# Patient Record
Sex: Female | Born: 1950 | Race: White | Hispanic: No | Smoking: Former smoker
Health system: Southern US, Community
[De-identification: ages and names within clinical notes are randomized; demographics above are authoritative.]

## PROBLEM LIST (undated history)

## (undated) DIAGNOSIS — I252 Old myocardial infarction: Secondary | ICD-10-CM

## (undated) DIAGNOSIS — I2510 Atherosclerotic heart disease of native coronary artery without angina pectoris: Secondary | ICD-10-CM

## (undated) DIAGNOSIS — I10 Essential (primary) hypertension: Secondary | ICD-10-CM

---

## 1998-05-29 ENCOUNTER — Other Ambulatory Visit: Admission: RE | Admit: 1998-05-29 | Discharge: 1998-05-29 | Payer: Self-pay | Admitting: Gynecology

## 2001-09-04 ENCOUNTER — Other Ambulatory Visit: Admission: RE | Admit: 2001-09-04 | Discharge: 2001-09-04 | Payer: Self-pay | Admitting: Gynecology

## 2002-11-07 ENCOUNTER — Other Ambulatory Visit: Admission: RE | Admit: 2002-11-07 | Discharge: 2002-11-07 | Payer: Self-pay | Admitting: Gynecology

## 2002-11-16 ENCOUNTER — Other Ambulatory Visit: Admission: RE | Admit: 2002-11-16 | Discharge: 2002-11-16 | Payer: Self-pay

## 2003-12-12 ENCOUNTER — Other Ambulatory Visit: Admission: RE | Admit: 2003-12-12 | Discharge: 2003-12-12 | Payer: Self-pay | Admitting: Gynecology

## 2004-12-15 ENCOUNTER — Other Ambulatory Visit: Admission: RE | Admit: 2004-12-15 | Discharge: 2004-12-15 | Payer: Self-pay | Admitting: Gynecology

## 2005-04-14 ENCOUNTER — Observation Stay (HOSPITAL_COMMUNITY): Admission: EM | Admit: 2005-04-14 | Discharge: 2005-04-15 | Payer: Self-pay | Admitting: Emergency Medicine

## 2006-01-06 ENCOUNTER — Other Ambulatory Visit: Admission: RE | Admit: 2006-01-06 | Discharge: 2006-01-06 | Payer: Self-pay | Admitting: Gynecology

## 2007-01-31 ENCOUNTER — Other Ambulatory Visit: Admission: RE | Admit: 2007-01-31 | Discharge: 2007-01-31 | Payer: Self-pay | Admitting: Gynecology

## 2010-07-24 NOTE — H&P (Signed)
NAME:  SAYAKA, HOEPPNER                  ACCOUNT NO.:  [id number]   MEDICAL RECORD NO.:  [id number]          PATIENT TYPE:  EMS   LOCATION:  MAJO                         FACILITY:  [facility]   PHYSICIAN:  [name], MD      DATE OF BIRTH:  07/16/1950   DATE OF ADMISSION:  04/14/2005  DATE OF DISCHARGE:                                HISTORY & PHYSICAL   Audio too short to transcribe (less than 5 seconds)           ______________________________  [name], MD     SY/MEDQ  D:  04/14/2005  T:  04/14/2005  Job:  [id number]

## 2010-07-24 NOTE — H&P (Signed)
[name], [name] NO.:  [id number]   MEDICAL RECORD NO.:  [id number]          PATIENT TYPE:  INP   LOCATION:  2620                         FACILITY:  [facility]   PHYSICIAN:  [name], MD      DATE OF BIRTH:  [birth year]   DATE OF ADMISSION:  04/14/2005  DATE OF DISCHARGE:  04/15/2005                                HISTORY & PHYSICAL   [name] is a 60 year old lady who is generally in good health. She smokes  one pack of cigarettes per day. She presented as outlined in the history and  physical with recurrent episodes of substernal chest pain, brief in  duration, associated on one occasion with radiation down the left arm. Also  noteworthy was that when the patient was being transported from a [facility] to [facility], she received two sublingual nitroglycerin  in the EMS vehicle and reported that she experienced a relief in her  symptoms from this medication. On arrival to the emergency room, her blood  pressure was 119/62. Electrocardiogram showed no significant ischemic  change.   PHYSICAL EXAM AT TIME OF ADMISSION:  At the time of admission, HEENT exam  was within normal limits. The chest was clear. Cardiovascular exam revealed  normal S1-S2 without rubs, murmurs or gallops. The abdomen was benign.  Neurologic testing and examination of the extremities was normal. Serial  cardiac enzymes were negative. LDL was 136. Consultation was obtained from  Dr. Verdis Prime of [facility]. He recommended with proceeding with a  Cardiolite study. Subsequent to admission, the patient was placed on  Lopressor 12.5 milligrams b.i.d., Lovenox 1 mg/kg subcutaneously every 12  hours as per pharmacy protocol and p.r.n. nitroglycerin as well as aspirin  325 milligrams daily. Cardiolite stress was carried on April 15, 2005. The  stress study was uneventful. The patient became somewhat dyspneic with  exertion. No EKG changes were identified. The  Cardiolite portion of the  study was completely normal per the verbal description of the radiologists.  Therefore, on April 15, 2005, the patient was discharged.   DISCHARGE DIAGNOSES:  1.  Atypical chest pain not of cardiac origin.  2.  A 30-pack-year smoking history.  3.  History of hysterectomy.   PLAN:  At discharge, the patient was advised that it was absolutely  essential that she discontinue cigarette smoking. Otherwise, she was given  no other specific recommendations. She was counseled to follow up with Dr.  Holley Bouche. Condition at time of discharge was good.           ______________________________  [name], MD     SY/MEDQ  D:  04/15/2005  T:  04/15/2005  Job:  [id number]   cc:   Holley Bouche, M.D.  Fax: [phone]   Lyn Records, M.D.  Fax: [phone]

## 2010-07-24 NOTE — H&P (Signed)
NAME:  [name], [name] NO.:  [id number]   MEDICAL RECORD NO.:  [id number]          PATIENT TYPE:  EMS   LOCATION:  MAJO                         FACILITY:  [facility]   PHYSICIAN:  [name], MD      DATE OF BIRTH:  [birth year]   DATE OF ADMISSION:  04/14/2005  DATE OF DISCHARGE:                                HISTORY & PHYSICAL   [name] is a 60 year old lady who works for an Scientist, product/process development office in  [employer].  She is generally in good health.  She admits to smoking about 1  pack of cigarettes per day.  Yesterday morning, she says she was putting on  cosmetics and looking in the mirror when she experienced a sudden onset of a  sharp pain in her chest which was different from the pain she had  previously.  She was slightly diaphoretic.  The pain was very brief in  duration.  After it resolved, she felt tired.  She then had 2 more episodes  of pain yesterday which caused her to be concerned.  Today she went to work  and, while at work, experienced the onset of a more severe substernal chest  pain which radiated down her left arm.  The pain was described as throbbing  in nature.  She presented to the [facility] in [facility] where  electrocardiogram was obtained which was normal.  In the Urgent Care, she  was given 4 aspirin to chew and directed to go to [facility] for  further evaluation.  EMS was called, and in transport to the emergency room,  she was given 2 sublingual nitroglycerin.  She said that her pain was 7/10  when she was in the ambulance.  The first nitroglycerin reduced the pain to  4/10, the second to 1/10.  She is quite definite that the pain was  significantly reduced by the nitroglycerin and that this was a relatively  rapid effect.   On arrival to the emergency room, her blood pressure was 119/62, temperature  97.1, O2 saturation 100%.  Electrocardiogram obtained at the Urgent Care  Center was reviewed and found to show no  significant ischemic changes.  In  light of this presentation, she is admitted for further evaluation.   PAST MEDICAL HISTORY:   MEDICATIONS:  Premarin 1 mg daily.   ALLERGIES:  No known drug allergies.   OPERATIONS:  1.  She has had a hysterectomy in the past.  2.  She has also had previous C section.   She has never been hospitalized for any medical problems.   MEDICAL ILLNESSES:  None.   FAMILY HISTORY:  Her father died at a young age, and the patient does not  know why.  Her brother has a history of hyperlipidemia and diabetes.  She  has several uncles who have hypertension.  She says that siblings are in  good health.   SOCIAL HISTORY:  She smokes about 1 pack of cigarettes per day.  She does  not use alcohol or drugs.   REVIEW OF SYSTEMS:  HEAD: She denies headache or dizziness. EYES: She denies  visual problems or diplopia. EAR, NOSE, THROAT: Denies earache, sinus pain,  or sore throat.  CHEST: Denies coughing, wheezing, or chest congestion.  CARDIOVASCULAR: Denies orthopnea or PND. GI: Denies nausea, vomiting,  abdominal pain, change in bowel habits, melena, or hematochezia.  GU: Denies  dysuria or urinary frequency. NEUROLOGIC: No history of seizure or stroke.  ENDOCRINE: Denies excessive thirst, urinary frequency, or nocturia.   PHYSICAL EXAMINATION:  HEENT:  Within normal limits.  CHEST: Remarkable for a few scattered wheezes.  CARDIOVASCULAR: Normal S1 and S2.  There are no rubs, murmurs, or gallops.  ABDOMEN:  Soft with normal bowel sounds.  No masses or tenderness.  NEUROLOGIC:  Testing normal.  EXTREMITIES:  Examination normal.   IMPRESSION:  1.  Atypical chest pain which was apparently dramatically relieved by      nitroglycerin.  Rule out myocardial infarction.  2.  A 30-pack-year smoking history.  3.  History of hysterectomy.   We will admit the patient to rule out myocardial infarction and obtain  cardiology consult or possible Cardiolite stress  test.           ______________________________  [name], MD     SY/MEDQ  D:  04/14/2005  T:  04/14/2005  Job:  [id number]   cc:   Holley Bouche, M.D.  Fax: [phone]   [facility]

## 2010-07-24 NOTE — Consult Note (Signed)
[name], REINO NO.:  [id number]   MEDICAL RECORD NO.:  [id number]          PATIENT TYPE:  INP   LOCATION:  2620                         FACILITY:  [facility]   PHYSICIAN:  Lyn Records, M.D.   DATE OF BIRTH:  1950-07-14   DATE OF CONSULTATION:  04/15/2005  DATE OF DISCHARGE:                                   CONSULTATION   REASON FOR CONSULTATION:  Chest discomfort.   CONCLUSION:  1.  Atypical chest discomfort described variously as a sharp discomfort, a      heaviness in the left precordium and a dull ache.  There was some      suggestion of pleuritic component.  Rule out coronary artery disease,      rule out pericarditis, rule out other.  2.  Smoking history, greater than 30 pack years.  3.  Hyperlipidemia.  4.  Bilateral salpingo-oophorectomy and hysterectomy.   RECOMMENDATIONS:  1.  Serial enzymes.  2.  Aspirin.  3.  Serial EKGs.  4.  D-dimer.  5.  Monitor.  6.  If rules out, stress Cardiolite should be performed given her risk      factor profile including age, tobacco use, hysterectomy with      oophorectomy.   COMMENTS:  The patient is a young-appearing 60 year old who is admitted  after a two-day history of atypical chest pain.  The initial episode of  discomfort occurred on the morning prior to admission (the actual day  preceding) when while dressing, she developed a mild ache in her chest that  lasted less than 10 minutes and resolved spontaneously.  This ache recurred  intermittently throughout the day.  It was not exertional related, nor was  there associated radiation, diaphoresis or other complaints.  There may have  been mild shortness of breath with it.  She had a nice evening the evening  prior to admission but upon awakening on the day of admission while blow-  drying her hair, she had discomfort again that she described as a pain in  the left upper chest and breast region that radiated into the left shoulder  and down the left  arm.  There was a sensation of heaviness in the arm and  hand with some tingling and also a sharp component.  This discomfort seemed  to be worse when she would take a deep breath or if she were lying down.  There was no nausea or sweating.  She came to the emergency room where she  was given aspirin and also given several nitroglycerin tablets with an  equivocal response.   EKG done during the discomfort was unremarkable for evidence of ischemia.  She did have an incomplete right bundle.   Significant past medical problems include tobacco use, hysterectomy and  salpingo-oophorectomy.   MEDICATIONS:  Premarin 0.1 mg per day.   FAMILY HISTORY:  Father died at a young age.  Mother has congestive heart  failure and diabetes.  She is living.   REVIEW OF SYSTEMS:  Unremarkable.   PHYSICAL EXAMINATION:  GENERAL: The patient is a pleasant  Caucasian in no  distress, appearing younger than her stated age of 60.  VITAL SIGNS:  Blood pressure 120/60, heart rate 65, respiratory rate 16.  HEENT:  Unremarkable.  NECK:  No jugular venous distention or carotid bruits.  LUNGS:  Clear, no pericardial rubs or murmurs heard.  ABDOMEN:  Soft.  EXTREMITIES:  No edema.  NEUROLOGIC:  Unremarkable.   EKG reveals incomplete right bundle.  No acute change.  All laboratory data  including initial __________ markers were normal.  Chest x-ray is  unremarkable.   DISCUSSION:  The patient's symptoms are atypical.  The etiology of the pain  is uncertain.  Possibilities at this time include ischemic heart disease  (unlikely), pleuropericarditis, musculoskeletal, or other.  We will rule out  myocardial infarction and get an ischemic evaluation if appropriate once the  data base is complete.      Lyn Records, M.D.  Electronically Signed     HWS/MEDQ  D:  04/15/2005  T:  04/15/2005  Job:  [id number]   cc:   Holley Bouche, M.D.  Fax: [phone]

## 2012-10-06 HISTORY — PX: CORONARY ANGIOPLASTY WITH STENT PLACEMENT: SHX49

## 2012-10-29 ENCOUNTER — Inpatient Hospital Stay (HOSPITAL_COMMUNITY)
Admission: EM | Admit: 2012-10-29 | Discharge: 2012-11-02 | DRG: 282 | Disposition: A | Discharge status: Home / Self Care | Payer: Medicaid Other | Attending: Cardiology | Admitting: Cardiology

## 2012-10-29 ENCOUNTER — Encounter (HOSPITAL_COMMUNITY): Payer: Self-pay | Admitting: Emergency Medicine

## 2012-10-29 ENCOUNTER — Emergency Department (HOSPITAL_COMMUNITY): Payer: Medicaid Other

## 2012-10-29 ENCOUNTER — Encounter (HOSPITAL_COMMUNITY)
Admission: EM | Disposition: A | Discharge status: Home / Self Care | Payer: Self-pay | Source: Home / Self Care | Attending: Cardiology

## 2012-10-29 DIAGNOSIS — E7800 Pure hypercholesterolemia, unspecified: Secondary | ICD-10-CM | POA: Diagnosis present

## 2012-10-29 DIAGNOSIS — I10 Essential (primary) hypertension: Secondary | ICD-10-CM | POA: Diagnosis present

## 2012-10-29 DIAGNOSIS — F17200 Nicotine dependence, unspecified, uncomplicated: Secondary | ICD-10-CM | POA: Diagnosis present

## 2012-10-29 DIAGNOSIS — I213 ST elevation (STEMI) myocardial infarction of unspecified site: Secondary | ICD-10-CM

## 2012-10-29 DIAGNOSIS — I2119 ST elevation (STEMI) myocardial infarction involving other coronary artery of inferior wall: Principal | ICD-10-CM

## 2012-10-29 DIAGNOSIS — I2510 Atherosclerotic heart disease of native coronary artery without angina pectoris: Secondary | ICD-10-CM | POA: Diagnosis present

## 2012-10-29 DIAGNOSIS — Z955 Presence of coronary angioplasty implant and graft: Secondary | ICD-10-CM

## 2012-10-29 DIAGNOSIS — Z23 Encounter for immunization: Secondary | ICD-10-CM

## 2012-10-29 DIAGNOSIS — D696 Thrombocytopenia, unspecified: Secondary | ICD-10-CM | POA: Diagnosis not present

## 2012-10-29 HISTORY — DX: Essential (primary) hypertension: I10

## 2012-10-29 HISTORY — DX: Old myocardial infarction: I25.2

## 2012-10-29 HISTORY — PX: LEFT HEART CATHETERIZATION WITH CORONARY ANGIOGRAM: SHX5451

## 2012-10-29 HISTORY — DX: Atherosclerotic heart disease of native coronary artery without angina pectoris: I25.10

## 2012-10-29 LAB — CBC
MCH: 33.7 pg (ref 26.0–34.0)
MCHC: 36.2 g/dL — ABNORMAL HIGH (ref 30.0–36.0)
MCV: 93.3 fL (ref 78.0–100.0)
RBC: 4.3 MIL/uL (ref 3.87–5.11)
RDW: 12.7 % (ref 11.5–15.5)

## 2012-10-29 LAB — BASIC METABOLIC PANEL
BUN: 9 mg/dL (ref 6–23)
GFR calc Af Amer: >90 mL/min (ref >90)
Glucose, Bld: 139 mg/dL — ABNORMAL HIGH (ref 70–99)

## 2012-10-29 LAB — TROPONIN I: Troponin I: >20 ng/mL (ref <0.30)

## 2012-10-29 SURGERY — LEFT HEART CATHETERIZATION WITH CORONARY ANGIOGRAM
Anesthesia: LOCAL

## 2012-10-29 MED ORDER — FENTANYL CITRATE 0.05 MG/ML IJ SOLN
INTRAMUSCULAR | Status: AC
  Filled 2012-10-29: qty 2

## 2012-10-29 MED ORDER — MIDAZOLAM HCL 2 MG/2ML IJ SOLN
INTRAMUSCULAR | Status: AC
  Filled 2012-10-29: qty 2

## 2012-10-29 MED ORDER — NITROGLYCERIN IN D5W 200-5 MCG/ML-% IV SOLN
10.0000 ug/min | INTRAVENOUS | Status: DC
  Administered 2012-10-29: 10 ug/min via INTRAVENOUS
  Filled 2012-10-29: qty 250

## 2012-10-29 MED ORDER — LIDOCAINE HCL (PF) 1 % IJ SOLN
INTRAMUSCULAR | Status: AC
  Filled 2012-10-29: qty 30

## 2012-10-29 MED ORDER — VERAPAMIL HCL 2.5 MG/ML IV SOLN
INTRAVENOUS | Status: AC
  Filled 2012-10-29: qty 2

## 2012-10-29 MED ORDER — HEPARIN (PORCINE) IN NACL 100-0.45 UNIT/ML-% IJ SOLN
600.0000 [IU]/h | INTRAMUSCULAR | Status: DC
  Administered 2012-10-29: 600 [IU]/h via INTRAVENOUS
  Filled 2012-10-29: qty 250

## 2012-10-29 MED ORDER — ONDANSETRON HCL 4 MG/2ML IJ SOLN
4.0000 mg | Freq: Once | INTRAMUSCULAR | Status: AC
  Administered 2012-10-29: 4 mg via INTRAVENOUS
  Filled 2012-10-29: qty 2

## 2012-10-29 MED ORDER — HEPARIN SODIUM (PORCINE) 5000 UNIT/ML IJ SOLN
60.0000 [IU]/kg | Freq: Once | INTRAMUSCULAR | Status: AC
  Administered 2012-10-29: 3100 [IU] via INTRAVENOUS
  Filled 2012-10-29: qty 1

## 2012-10-29 MED ORDER — EPTIFIBATIDE 75 MG/100ML IV SOLN
INTRAVENOUS | Status: AC
  Filled 2012-10-29: qty 100

## 2012-10-29 MED ORDER — NITROGLYCERIN 0.2 MG/ML ON CALL CATH LAB
INTRAVENOUS | Status: AC
  Filled 2012-10-29: qty 1

## 2012-10-29 MED ORDER — TICAGRELOR 90 MG PO TABS
180.0000 mg | ORAL_TABLET | Freq: Once | ORAL | Status: DC
  Filled 2012-10-29: qty 2

## 2012-10-29 MED ORDER — HEPARIN (PORCINE) IN NACL 2-0.9 UNIT/ML-% IJ SOLN
INTRAMUSCULAR | Status: AC
  Filled 2012-10-29: qty 1500

## 2012-10-29 MED ORDER — MORPHINE SULFATE 4 MG/ML IJ SOLN
4.0000 mg | Freq: Once | INTRAMUSCULAR | Status: AC
  Administered 2012-10-29: 4 mg via INTRAVENOUS
  Filled 2012-10-29: qty 1

## 2012-10-29 MED ORDER — NITROGLYCERIN 0.4 MG SL SUBL
0.4000 mg | SUBLINGUAL_TABLET | SUBLINGUAL | Status: DC | PRN
  Administered 2012-10-29 (×2): 0.4 mg via SUBLINGUAL
  Filled 2012-10-29: qty 25

## 2012-10-29 MED ORDER — BIVALIRUDIN 250 MG IV SOLR
INTRAVENOUS | Status: AC
  Filled 2012-10-29: qty 250

## 2012-10-29 MED ORDER — ASPIRIN 81 MG PO CHEW
243.0000 mg | CHEWABLE_TABLET | Freq: Once | ORAL | Status: AC
  Administered 2012-10-29: 243 mg via ORAL
  Filled 2012-10-29: qty 3

## 2012-10-29 NOTE — ED Notes (Signed)
Pt placed on Zoll and groin shaved.

## 2012-10-29 NOTE — H&P (Signed)
[name] is an 62 y.o. female.   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 status post PTCA stenting to RCA approximately 9 years ago at [facility], hypertension, hypercholesterolemia, tobacco abuse, positive family history of coronary artery disease, came to the ER complaining of recurrent burning retrosternal chest pain grade 10 over 10 since yesterday evening radiating to both shoulder and arm associated with shortness of breath and diaphoresis did not seek any medical attention as she was in [place] visiting her friend, came home took aspirin and nitroglycerin with partial relief but due to recurrent chest pain decided to come to the ER patient presently complains of 3/6 burning chest pain EKG done in the ER showed normal sinus rhythm with Q waves in inferior with ST elevation and minor respirable changes in lead 1 and aVL and also her RSR pattern with ST depression in V1 to V3 suggestive of inferoposterior wall MI. Patient was also noted to have troponin I. Of above 45. Patient denies any palpitation lightheadedness or syncope but states feels overall tired and weak. States she has been off most of her medication for last few weeks.  Past Medical History  Diagnosis Date  . MI, old   . Hypertension   . Coronary artery disease     Past Surgical History  Procedure Laterality Date  . Cesarean section      X3    No family history on file. Social History:  reports that she quit smoking 7 days ago. Her smoking use included Cigarettes. She smoked 0.00 packs per day. She does not have any smokeless tobacco history on file. She reports that  drinks alcohol. She reports that she does not use illicit drugs.  Allergies: No Known Allergies   (Not in a hospital admission)  Results for orders placed during the hospital encounter of  10/29/12 (from the past 48 hour(s))  CBC     Status: Abnormal   Collection Time    10/29/12  8:05 PM      Result Value Range   WBC 14.5 (*) 4.0 - 10.5 K/uL   RBC 4.30  3.87 - 5.11 MIL/uL   Hemoglobin 14.5  12.0 - 15.0 g/dL   HCT 16.1  09.6 - 04.5 %   MCV 93.3  78.0 - 100.0 fL   MCH 33.7  26.0 - 34.0 pg   MCHC 36.2 (*) 30.0 - 36.0 g/dL   RDW 40.9  81.1 - 91.4 %   Platelets 162  150 - 400 K/uL  BASIC METABOLIC PANEL     Status: Abnormal   Collection Time    10/29/12  8:05 PM      Result Value Range   Sodium 131 (*) 135 - 145 mEq/L   Potassium 3.9  3.5 - 5.1 mEq/L   Chloride 95 (*) 96 - 112 mEq/L   CO2 23  19 - 32 mEq/L   Glucose, Bld 139 (*) 70 - 99 mg/dL   BUN 9  6 - 23 mg/dL   Creatinine, Ser 7.82  0.50 - 1.10 mg/dL   Calcium 9.5  8.4 - 95.6 mg/dL   GFR calc non Af Amer >90  >90 mL/min   GFR calc Af Amer >90  >90 mL/min   Comment: (NOTE)     The eGFR has been calculated using the CKD EPI equation.     This calculation  has not been validated in all clinical situations.     eGFR's persistently <90 mL/min signify possible Chronic Kidney     Disease.  TROPONIN I     Status: Abnormal   Collection Time    10/29/12  8:05 PM      Result Value Range   Troponin I >20.00 (*) <0.30 ng/mL   Comment: REPEATED TO VERIFY     CRITICAL RESULT CALLED TO, READ BACK BY AND VERIFIED WITH:     ALBR[facility] 10/29/12 2135 WAYK  POCT I-STAT TROPONIN I     Status: Abnormal   Collection Time    10/29/12  8:13 PM      Result Value Range   Troponin i, poc 42.69 (*) 0.00 - 0.08 ng/mL   Comment NOTIFIED PHYSICIAN     Comment 3            Comment: Due to the release kinetics of cTnI,     a negative result within the first hours     of the onset of symptoms does not rule out     myocardial infarction with certainty.     If myocardial infarction is still suspected,     repeat the test at appropriate intervals.   Dg Chest 2 View  10/29/2012   *RADIOLOGY REPORT*  Clinical Data: Chest pain.  CHEST  - 2 VIEW  Comparison: 09/23/2011 and 08/15/2006  Findings: Lungs are clear.  Cardiomediastinal silhouette and remainder of the exam is unchanged.  IMPRESSION: No acute cardiopulmonary disease.   Original Report Authenticated By: Elberta Fortis, M.D.    Review of Systems  Constitutional: Negative for fever and chills.  HENT: Negative for hearing loss.   Eyes: Negative for blurred vision and double vision.  Cardiovascular: Positive for chest pain. Negative for palpitations, orthopnea, claudication and leg swelling.  Gastrointestinal: Positive for nausea. Negative for vomiting, abdominal pain and diarrhea.  Neurological: Negative for dizziness and headaches.    Blood pressure 134/75, pulse 67, temperature 98.9 F (37.2 C), temperature source Oral, resp. rate 14, height 5\' 3"  (1.6 m), weight 51.256 kg (113 lb), SpO2 98.00%. Physical Exam  Constitutional: She is oriented to person, place, and time.  HENT:  Head: Normocephalic.  Eyes: Conjunctivae are normal. Pupils are equal, round, and reactive to light. Left eye exhibits no discharge. No scleral icterus.  Neck: Normal range of motion. Neck supple. No JVD present. No tracheal deviation present. No thyromegaly present.  Cardiovascular: Normal rate and regular rhythm.  Exam reveals no friction rub.   Murmur (Soft systolic murmur and S4 gallop noted) heard. Respiratory: Effort normal and breath sounds normal. No respiratory distress. She has no wheezes. She has no rales.  GI: Soft. Bowel sounds are normal. She exhibits no distension. There is no tenderness. There is no rebound.  Musculoskeletal: She exhibits no edema and no tenderness.  Neurological: She is alert and oriented to person, place, and time.     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 death MI stroke need for emergency CABG local last complications etc. and consented for PCI.  Robynn Pane 10/29/2012, 9:44 PM

## 2012-10-29 NOTE — ED Notes (Signed)
Per EMS - pt c/o CP onset around 8pm last night. Pt has previous MI and stents. sts she has been having achy pain bilateral upper extremities and headache. Pt vomiting last night and today. Pt took 3 nitro, pain resolved still having headache. EMS started a 20G in left hand. NSR on monitor, elevation in lead 3, left bundle branch block. Upon arrival to ED pt sts the pain is starting to come back.

## 2012-10-29 NOTE — ED Provider Notes (Signed)
CSN: [id number]     Arrival date & time 10/29/12  1859 History     First MD Initiated Contact with Patient 10/29/12 2012     No chief complaint on file.  (Consider location/radiation/quality/duration/timing/severity/associated sxs/prior Treatment) HPI Comments: Patient is a 62 year old female medical history significant for MI, RCA stent placement 7 years ago, hypertension, coronary artery disease presented to emergency department for 24 hours of central chest pressure with radiation to bilateral shoulders with associated nausea, vomiting, diaphoresis. Patient states her pain was "25/10" prior to taking a sublingual nitroglycerin and a baby aspirin prior to arrival to the emergency department. Patient states her pain is down to a 3/10. Patient states her cardiologist is out of [facility]. Her last stress test was 6 months ago with her last catheterization 4 years ago.  The history is provided by the patient.    Past Medical History  Diagnosis Date  . MI, old   . Hypertension   . Coronary artery disease    Past Surgical History  Procedure Laterality Date  . Cesarean section      X3   No family history on file. History  Substance Use Topics  . Smoking status: Former Smoker    Types: Cigarettes    Quit date: 10/22/2012  . Smokeless tobacco: Not on file  . Alcohol Use: Yes     Comment: occasionally   OB History   Grav Para Term Preterm Abortions TAB SAB Ect Mult Living                 Review of Systems  Constitutional: Positive for diaphoresis.  Respiratory: Negative for shortness of breath.   Cardiovascular: Positive for chest pain.  Gastrointestinal: Positive for nausea and vomiting.  All other systems reviewed and are negative.    Allergies  Review of patient's allergies indicates no known allergies.  Home Medications   No current outpatient prescriptions on file. BP 127/76  Pulse 77  Temp(Src) 98.9 F (37.2 C) (Oral)  Resp 20  Ht 5\' 3"  (1.6 m)   Wt 113 lb (51.256 kg)  BMI 20.02 kg/m2  SpO2 98% Physical Exam  Constitutional: She is oriented to person, place, and time. She appears well-developed and well-nourished.  HENT:  Head: Normocephalic and atraumatic.  Right Ear: External ear normal.  Left Ear: External ear normal.  Nose: Nose normal.  Mouth/Throat: Oropharynx is clear and moist.  Eyes: Conjunctivae and EOM are normal. Pupils are equal, round, and reactive to light.  Neck: Normal range of motion. Neck supple.  Cardiovascular: Normal rate, regular rhythm, normal heart sounds and intact distal pulses.   Pulmonary/Chest: Effort normal and breath sounds normal. No respiratory distress.  Abdominal: Soft. There is no tenderness.  Musculoskeletal: She exhibits no edema.  Neurological: She is alert and oriented to person, place, and time.  Skin: Skin is warm and dry.    ED Course   Procedures (including critical care time)  CRITICAL CARE Performed by: Francee Piccolo L   Total critical care time: 60 minutes  Critical care time was exclusive of separately billable procedures and treating other patients.  Critical care was necessary to treat or prevent imminent or life-threatening deterioration.  Critical care was time spent personally by me on the following activities: development of treatment plan with patient and/or surrogate as well as nursing, discussions with consultants, evaluation of patient's response to treatment, examination of patient, obtaining history from patient or surrogate, ordering and performing treatments and interventions, ordering and review  of laboratory studies, ordering and review of radiographic studies, pulse oximetry and re-evaluation of patient's condition.   Medications  nitroGLYCERIN (NITROSTAT) SL tablet 0.4 mg ( Sublingual MAR Hold 10/29/12 2212)  nitroGLYCERIN 0.2 mg/mL in dextrose 5 % infusion ( Intravenous MAR Hold 10/29/12 2212)  heparin ADULT infusion 100 units/mL (25000  units/250 mL) (600 Units/hr Intravenous New Bag/Given 10/29/12 2122)  Ticagrelor (BRILINTA) tablet 180 mg ( Oral MAR Hold 10/29/12 2212)  ondansetron (ZOFRAN) injection 4 mg (4 mg Intravenous Given 10/29/12 2029)  morphine 4 MG/ML injection 4 mg (4 mg Intravenous Given 10/29/12 2045)  heparin injection 3,100 Units (3,100 Units Intravenous Given 10/29/12 2057)  aspirin chewable tablet 243 mg (243 mg Oral Given 10/29/12 2055)  bivalirudin (ANGIOMAX) 250 MG injection (not administered)  fentaNYL (SUBLIMAZE) 0.05 MG/ML injection (not administered)  midazolam (VERSED) 2 MG/2ML injection (not administered)  lidocaine (PF) (XYLOCAINE) 1 % injection (not administered)  nitroGLYCERIN (NTG ON-CALL) 0.2 mg/mL injection (not administered)  heparin 2-0.9 UNIT/ML-% infusion (not administered)  eptifibatide (INTEGRILIN) 75 mg / 100 mL infusion (not administered)  verapamil (ISOPTIN) 2.5 MG/ML injection (not administered)    Date: 10/29/2012  Rate: 62   Rhythm: normal sinus rhythm  QRS Axis: normal  Intervals: normal  ST/T Wave abnormalities: ST elevation 1mm in V3, less than 1mm elevation in lead II and aVF  Conduction Disutrbances:right bundle branch block  Narrative Interpretation:   Old EKG Reviewed: none available    Labs Reviewed  CBC - Abnormal; Notable for the following:    WBC 14.5 (*)    MCHC 36.2 (*)    All other components within normal limits  BASIC METABOLIC PANEL - Abnormal; Notable for the following:    Sodium 131 (*)    Chloride 95 (*)    Glucose, Bld 139 (*)    All other components within normal limits  TROPONIN I - Abnormal; Notable for the following:    Troponin I >20.00 (*)    All other components within normal limits  POCT I-STAT TROPONIN I - Abnormal; Notable for the following:    Troponin i, poc 42.69 (*)    All other components within normal limits  HEPARIN LEVEL (UNFRACTIONATED)  CBC   Dg Chest 2 View  10/29/2012   *RADIOLOGY REPORT*  Clinical Data: Chest pain.   CHEST - 2 VIEW  Comparison: 09/23/2011 and 08/15/2006  Findings: Lungs are clear.  Cardiomediastinal silhouette and remainder of the exam is unchanged.  IMPRESSION: No acute cardiopulmonary disease.   Original Report Authenticated By: Elberta Fortis, M.D.   1. Acute MI, inferoposterior wall     MDM  Pt with CP x 24 hours with nausea, vomiting, and diaphoresis w/ history of RCA stent placement 7 years ago. CP initially alleviated with ASA, Nitro SL, and Morphine, but pain returned during Dr. Annitta Jersey evaluation of patient. EKG changes noted above with interpretation below. EKG ST elevation 1 mm in V3 less than 1 mm elevation in lead II and aVF, inferior Q waves, troponin 43, suspect patient either had STEMI or NSTEMI yesterday, Harwani to see Pt in ED for admit. Heparin gtt and bolus started, along with Nitro gtt. Dr. Sharyn Lull will take patient to cath lab with returned pain and EKG findings. After her money greater than STEMI T. STEMI status. Patient transported to cath lab.    Jeannetta Ellis, PA-C 10/29/12 2256

## 2012-10-29 NOTE — ED Notes (Signed)
Dr Fonnie Jarvis notified of pt high Troponin level 42.69

## 2012-10-29 NOTE — ED Provider Notes (Signed)
Medical screening examination/treatment/procedure(s) were conducted as a shared visit with non-physician practitioner(s) and myself.  I personally evaluated the patient during the encounter.  Greater than 24 hours of constant CP now pain free; yesterday and all night had burning chest pain radiating to both shoulders and arms nausea vomiting shortness breath and sweats, today less intense symptoms but still had burning chest discomfort all day into this evening without associated Sxs today, EKG ST elevation 1 mm in lead III and less than 1 mm elevation in leads II and aVF, inferior Q waves, troponin 43, suspect patient either had STEMI or NSTEMI yesterday, Harwani to see Pt in ED for admit.  When Dr. Sharyn Lull saw the patient in the ED the patient developed recurrent pain and Dr. Sharyn Lull activated Code STEMI.  Hurman Horn, MD 10/31/12 2056

## 2012-10-29 NOTE — Progress Notes (Signed)
ANTICOAGULATION CONSULT NOTE - Initial Consult  Pharmacy Consult for Heparin Indication: chest pain/ACS  No Known Allergies  Patient Measurements: Height: 5\' 3"  (160 cm) Weight: 113 lb (51.256 kg) IBW/kg (Calculated) : 52.4   Vital Signs: Temp: 98.9 F (37.2 C) (08/24 1942) Temp src: Oral (08/24 1942) BP: 129/78 mmHg (08/24 2016) Pulse Rate: 73 (08/24 1945)  Labs:  Recent Labs  10/29/12 2005  HGB 14.5  HCT 40.1  PLT 162    CrCl is unknown because no creatinine reading has been taken.   Medical History: Past Medical History  Diagnosis Date  . MI, old   . Hypertension   . Coronary artery disease     Medications:   (Not in a hospital admission)  Assessment: 62 yo F admitted 10/29/2012  With CP.  Pharmacy consulted to dose heparin.  No bleeding noted.  Goal of Therapy:  Heparin level 0.3-0.7 units/ml Monitor platelets by anticoagulation protocol: Yes   Plan:  Give 3000 units bolus x 1 Start heparin infusion at 600 units/hr Check anti-Xa level in 6 hours and daily while on heparin Continue to monitor H&H and platelets   Thank you for allowing pharmacy to be a part of this patients care team.  Lovenia Kim Pharm.D., BCPS Clinical Pharmacist 10/29/2012 9:00 PM Pager: [phone] Phone: [phone]

## 2012-10-29 NOTE — ED Notes (Signed)
Dr Sharyn Lull paged Code Stemi on patient at this time.

## 2012-10-29 NOTE — ED Notes (Signed)
Admitting MD in room with pt. 

## 2012-10-30 ENCOUNTER — Encounter (HOSPITAL_COMMUNITY): Payer: Self-pay | Admitting: *Deleted

## 2012-10-30 ENCOUNTER — Other Ambulatory Visit: Payer: Self-pay

## 2012-10-30 LAB — LIPID PANEL
Cholesterol: 145 mg/dL (ref 0–200)
HDL: 48 mg/dL (ref >39)
Total CHOL/HDL Ratio: 3 RATIO

## 2012-10-30 LAB — TROPONIN I
Troponin I: >20 ng/mL (ref <0.30)
Troponin I: >20 ng/mL (ref <0.30)
Troponin I: >20 ng/mL (ref <0.30)

## 2012-10-30 LAB — COMPREHENSIVE METABOLIC PANEL
BUN: 7 mg/dL (ref 6–23)
Calcium: 8.1 mg/dL — ABNORMAL LOW (ref 8.4–10.5)
GFR calc Af Amer: >90 mL/min (ref >90)
Glucose, Bld: 133 mg/dL — ABNORMAL HIGH (ref 70–99)
Sodium: 134 mEq/L — ABNORMAL LOW (ref 135–145)
Total Protein: 5.6 g/dL — ABNORMAL LOW (ref 6.0–8.3)

## 2012-10-30 LAB — BASIC METABOLIC PANEL
BUN: 13 mg/dL (ref 6–23)
Calcium: 8.2 mg/dL — ABNORMAL LOW (ref 8.4–10.5)
GFR calc non Af Amer: >90 mL/min (ref >90)
Glucose, Bld: 128 mg/dL — ABNORMAL HIGH (ref 70–99)

## 2012-10-30 LAB — CBC WITH DIFFERENTIAL/PLATELET
Basophils Relative: 1 % (ref 0–1)
Eosinophils Absolute: 0 10*3/uL (ref 0.0–0.7)
Hemoglobin: 12.2 g/dL (ref 12.0–15.0)
MCH: 33 pg (ref 26.0–34.0)
MCHC: 35 g/dL (ref 30.0–36.0)
Monocytes Absolute: 1.7 10*3/uL — ABNORMAL HIGH (ref 0.1–1.0)
Monocytes Relative: 15 % — ABNORMAL HIGH (ref 3–12)
Neutrophils Relative %: 74 % (ref 43–77)
RDW: 12.7 % (ref 11.5–15.5)

## 2012-10-30 LAB — CBC
HCT: 32.9 % — ABNORMAL LOW (ref 36.0–46.0)
Hemoglobin: 11.4 g/dL — ABNORMAL LOW (ref 12.0–15.0)
MCV: 95.9 fL (ref 78.0–100.0)
RDW: 12.9 % (ref 11.5–15.5)
WBC: 9.1 10*3/uL (ref 4.0–10.5)

## 2012-10-30 LAB — PROTIME-INR: INR: 1.72 — ABNORMAL HIGH (ref 0.00–1.49)

## 2012-10-30 MED ORDER — ASPIRIN EC 81 MG PO TBEC: 81.0000 mg | DELAYED_RELEASE_TABLET | Freq: Every day | ORAL | Status: DC

## 2012-10-30 MED ORDER — SODIUM CHLORIDE 0.9 % IV SOLN: 0.2500 mg/kg/h | INTRAVENOUS | Status: AC

## 2012-10-30 MED ORDER — ACETAMINOPHEN 325 MG PO TABS: 650.0000 mg | ORAL_TABLET | ORAL | Status: DC | PRN

## 2012-10-30 MED ORDER — ASPIRIN 81 MG PO CHEW
81.0000 mg | CHEWABLE_TABLET | Freq: Every day | ORAL | Status: DC
  Administered 2012-10-30 – 2012-11-02 (×4): 81 mg via ORAL
  Filled 2012-10-30 (×4): qty 1

## 2012-10-30 MED ORDER — TICAGRELOR 90 MG PO TABS: 180.0000 mg | ORAL_TABLET | Freq: Once | ORAL | Status: DC

## 2012-10-30 MED ORDER — PNEUMOCOCCAL VAC POLYVALENT 25 MCG/0.5ML IJ INJ
0.5000 mL | INJECTION | INTRAMUSCULAR | Status: AC
  Administered 2012-10-31: 0.5 mL via INTRAMUSCULAR
  Filled 2012-10-30: qty 0.5

## 2012-10-30 MED ORDER — SODIUM CHLORIDE 0.9 % IV SOLN: 250.0000 mL | INTRAVENOUS | Status: DC | PRN

## 2012-10-30 MED ORDER — BIOTENE DRY MOUTH MT LIQD
15.0000 mL | Freq: Two times a day (BID) | OROMUCOSAL | Status: DC
  Administered 2012-10-30 – 2012-11-01 (×4): 15 mL via OROMUCOSAL

## 2012-10-30 MED ORDER — SODIUM CHLORIDE 0.9 % IV SOLN: INTRAVENOUS | Status: DC

## 2012-10-30 MED ORDER — SODIUM CHLORIDE 0.9 % IV BOLUS (SEPSIS)
250.0000 mL | Freq: Once | INTRAVENOUS | Status: AC
  Administered 2012-10-30: 250 mL via INTRAVENOUS

## 2012-10-30 MED ORDER — SODIUM CHLORIDE 0.9 % IJ SOLN: 3.0000 mL | Freq: Two times a day (BID) | INTRAMUSCULAR | Status: DC

## 2012-10-30 MED ORDER — ASPIRIN 81 MG PO CHEW: 324.0000 mg | CHEWABLE_TABLET | ORAL | Status: DC

## 2012-10-30 MED ORDER — ATROPINE SULFATE 1 MG/ML IJ SOLN
INTRAMUSCULAR | Status: AC
  Filled 2012-10-30: qty 1

## 2012-10-30 MED ORDER — ATORVASTATIN CALCIUM 80 MG PO TABS
80.0000 mg | ORAL_TABLET | Freq: Every day | ORAL | Status: DC
  Administered 2012-10-30 – 2012-11-01 (×3): 80 mg via ORAL
  Filled 2012-10-30 (×4): qty 1

## 2012-10-30 MED ORDER — ONDANSETRON HCL 4 MG/2ML IJ SOLN: 4.0000 mg | Freq: Four times a day (QID) | INTRAMUSCULAR | Status: DC | PRN

## 2012-10-30 MED ORDER — PANTOPRAZOLE SODIUM 40 MG PO TBEC
40.0000 mg | DELAYED_RELEASE_TABLET | Freq: Every day | ORAL | Status: DC
  Administered 2012-10-30 – 2012-11-02 (×4): 40 mg via ORAL
  Filled 2012-10-30 (×4): qty 1

## 2012-10-30 MED ORDER — ZOLPIDEM TARTRATE 5 MG PO TABS
5.0000 mg | ORAL_TABLET | Freq: Every evening | ORAL | Status: DC | PRN
  Administered 2012-10-30 – 2012-11-01 (×3): 5 mg via ORAL
  Filled 2012-10-30 (×3): qty 1

## 2012-10-30 MED ORDER — NITROGLYCERIN 0.4 MG SL SUBL: 0.4000 mg | SUBLINGUAL_TABLET | SUBLINGUAL | Status: DC | PRN

## 2012-10-30 MED ORDER — NITROGLYCERIN IN D5W 200-5 MCG/ML-% IV SOLN: 5.0000 ug/min | INTRAVENOUS | Status: DC

## 2012-10-30 MED ORDER — SODIUM CHLORIDE 0.9 % IJ SOLN: 3.0000 mL | INTRAMUSCULAR | Status: DC | PRN

## 2012-10-30 MED ORDER — TICAGRELOR 90 MG PO TABS
90.0000 mg | ORAL_TABLET | Freq: Two times a day (BID) | ORAL | Status: DC
  Administered 2012-10-30 – 2012-11-02 (×7): 90 mg via ORAL
  Filled 2012-10-30 (×8): qty 1

## 2012-10-30 MED ORDER — ASPIRIN 300 MG RE SUPP: 300.0000 mg | RECTAL | Status: DC

## 2012-10-30 MED ORDER — SODIUM CHLORIDE 0.9 % IV SOLN
INTRAVENOUS | Status: AC
  Administered 2012-10-30: 01:00:00 via INTRAVENOUS

## 2012-10-30 MED ORDER — EPTIFIBATIDE 75 MG/100ML IV SOLN
2.0000 ug/kg/min | INTRAVENOUS | Status: AC
  Administered 2012-10-29: 2 ug/kg/min via INTRAVENOUS

## 2012-10-30 MED ORDER — EPTIFIBATIDE 75 MG/100ML IV SOLN: 2.0000 ug/kg/min | INTRAVENOUS | Status: DC

## 2012-10-30 MED ORDER — NITROGLYCERIN IN D5W 200-5 MCG/ML-% IV SOLN: 3.0000 ug/min | INTRAVENOUS | Status: DC

## 2012-10-30 MED ORDER — METOPROLOL TARTRATE 12.5 MG HALF TABLET
12.5000 mg | ORAL_TABLET | Freq: Two times a day (BID) | ORAL | Status: DC
  Administered 2012-10-30 – 2012-11-02 (×6): 12.5 mg via ORAL
  Filled 2012-10-30 (×9): qty 1

## 2012-10-30 MED FILL — Dextrose Inj 5%: INTRAVENOUS | Qty: 1000 | Status: AC

## 2012-10-30 NOTE — Cardiovascular Report (Signed)
[name], [name]                ACCOUNT NO.:  [id number]  MEDICAL RECORD NO.:  [id number]  LOCATION:  2H09C                        FACILITY:  [facility]  PHYSICIAN:  Margaree Sandhu N. Sharyn Lull, M.D. DATE OF BIRTH:  1950/06/04  DATE OF PROCEDURE:  10/29/2012 DATE OF DISCHARGE:                           CARDIAC CATHETERIZATION   PROCEDURE: 1. Left cardiac catheterization with selective left and right coronary     angiography, left ventriculography via right groin using Judkins     technique. 2. Successful PTCA to 100% occluded proximal RCA using 2.5 x 12 mm     long Sprinter balloon. 3. Successful deployment of 3.0 x 33 mm long Xience Xpedition drug-     eluting stent in proximal and mid RCA. 4. Successful postdilatation of this stent using 3.25 x 20 mm long [place]     Trek balloon. 5. Successful PTCA to PDA branch of RCA using 2.5 x 12 mm long Trek     balloon. 6. Successful PTCA to PLV branch using 2.0 x 12 mm long mini Trek     balloon.  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  The patient did not seek any medical attention as she was in [place] visiting her friend, came home, took aspirin and sublingual nitro with partial relief.  Due to recurrent chest pain, decided to come to the ER via EMS.  The patient presently complains of 3/6 burning chest pain.  EKG done in the ER showed normal sinus rhythm with Q-waves in inferior leads with ST elevation and minor  reciprocal changes in lead 1 aVL and also RSR pattern with ST depression in V1-V3 suggestive of inferoposterior wall MI.  The patient also was noted to have a troponin I of above 45.  The patient denies any palpitation, lightheadedness, or syncope, but states feels overall weak and tired. States she has been off her medications for last few weeks.  Due to typical anginal chest pain, EKG changes elevated cardiac enzymes. Discussed with the patient regarding emergency left cath, possible PTCA stenting, its risks and benefits, i.e., death, MI, stroke, need for emergency CABG, risk of restenosis, local vascular complications, etc. and consented for PCI.  DESCRIPTION OF PROCEDURE:  After obtaining the informed consent, the patient was brought to the cath lab and was placed on fluoroscopy table. Right groin was prepped and draped in usual fashion.  A 1% Xylocaine was used for local anesthesia in the right groin.  With the help of thin wall needle, a 6-French arterial sheath was placed.  The sheath was aspirated and flushed.  A 6-French left Judkins catheter was advanced over the wire under fluoroscopic guidance up to the ascending aorta.  Wire was pulled out. The catheter was aspirated and connected to  the Manifold.  Catheter was further advanced and engaged into left coronary ostium.  Multiple views of the left system were taken.  Next, the catheter was disengaged and was pulled out over the wire and was replaced with 6-French right Judkins catheter, which was advanced over the wire under fluoroscopic guidance up to the ascending aorta.  Wire was pulled out.  The catheter was aspirated and connected to the Manifold.  Catheter was further advanced and engaged into right coronary ostium.  A single view of right coronary artery was obtained.  Next, the catheter was disengaged and was pulled out over the wire at the end of the procedure and was replaced with 6-French pigtail catheter, which was  advanced over the wire under fluoroscopic guidance up to the ascending aorta.  Wire was pulled out. The catheter was aspirated and connected to the Manifold.  Catheter was further advanced across the aortic valve into the LV.  LV pressures were recorded.  LV graft was done in 30-degree RAO position.  Post-angiographic pressures were recorded from LV and then pullback pressures were recorded from the aorta.  There was no gradient across the aortic valve.  Next, pigtail catheter was pulled out over the wire.  Sheaths were aspirated and flushed.  FINDINGS:  LV showed mid inferior wall severe hypokinesia, EF of 50-55%, left main was short.  LAD has 20%-30% proximal and mid stenosis. Diagonal 1 and 2 are very very small.  Ramus was very very small.  Left circumflex has 20%-25% ostial and mid stenosis.  OM 1 was moderate size, which has mild disease.  OM 2 was small, which was patent.  RCA was 100% occluded beyond the proximal portion with TIMI 0 flow, therefore the stent with large thrombus burden.  Interventional procedure, successful PTCA to proximal RCA was done initially using 2.5 x 12 mm long Sprinter balloon for predilatation.  Angiogram showed large thrombus burden and then manual aspiration thrombectomy was done using Pronto aspiration catheter.  Two passes were done and then Xience Xpedition drug-eluting stent 3 mm x 33 mm long Xience Xpedition drug-eluting stent was deployed in proximal and mid RCA at 11 atmospheric pressure.  The stent was post dilated using 3.25 x 20 mm long [place] Trek balloon going up to 18-20 atmospheric pressure.  Lesion dilated from 100% to 0% residual with excellent TIMI grade 3 distal flow and PDA, but the occlusion of very small PLV branch.  Angiogram also showed 85%-90% mid PDA stenosis just prior to the stent and then PTCA to the PDA was done using 2.5 x 12 mm long Trek balloon. Lesion dilated from 85%-90% to less than 20% residual with excellent TIMI  grade 3 distal flow without evidence of dissection or distal embolization and then PTCA to 100% occluded.  PLV branch was done using 2.0 x 12 mm long mini Trek balloon.  Lesion dilated from 100% to less than 20% residual.  The patient received weight based Angiomax, 180 mg of Brilinta, and Integrilin during the procedure.  The patient tolerated the procedure well.  There were no complications.  The patient was transferred to CCU in stable condition.     Eduardo Osier. Sharyn Lull, M.D.     MNH/MEDQ  D:  10/30/2012  T:  10/30/2012  Job:  [id number]

## 2012-10-30 NOTE — Progress Notes (Signed)
Subjective:  Patient denies any chest pain or shortness of breath.  Objective:  Vital Signs in the last 24 hours: Temp:  [98.5 F (36.9 C)-99.4 F (37.4 C)] 99.4 F (37.4 C) (08/25 0730) Pulse Rate:  [59-78] 69 (08/25 1100) Resp:  [11-31] 18 (08/25 1100) BP: (73-157)/(47-87) 103/70 mmHg (08/25 1100) SpO2:  [92 %-100 %] 97 % (08/25 1100) Weight:  [51.256 kg (113 lb)-51.5 kg (113 lb 8.6 oz)] 51.5 kg (113 lb 8.6 oz) (08/25 0010)  Intake/Output from previous day: 08/24 0701 - 08/25 0700 In: 1358.1 [I.V.:1358.1] Out: 175 [Urine:175] Intake/Output from this shift: Total I/O In: 300 [I.V.:300] Out: 750 [Urine:750]  Physical Exam: Neck: no adenopathy, no carotid bruit, no JVD and supple, symmetrical, trachea midline Lungs: clear to auscultation bilaterally Heart: regular rate and rhythm, S1, S2 normal, no murmur, click, rub or gallop Abdomen: soft, non-tender; bowel sounds normal; no masses,  no organomegaly Extremities: extremities normal, atraumatic, no cyanosis or edema and Right groin stable  Lab Results:  Recent Labs  10/30/12 0110 10/30/12 0500  WBC 11.0* 9.1  HGB 12.2 11.4*  PLT 125* 125*    Recent Labs  10/30/12 0110 10/30/12 0445  NA 134* 136  K 3.7 3.8  CL 100 103  CO2 26 27  GLUCOSE 133* 128*  BUN 7 13  CREATININE 0.50 0.70    Recent Labs  10/30/12 0110 10/30/12 0445  TROPONINI >20.00* >20.00*   Hepatic Function Panel  Recent Labs  10/30/12 0110  PROT 5.6*  ALBUMIN 3.2*  AST 188*  ALT 40*  ALKPHOS 59  BILITOT 0.8    Recent Labs  10/30/12 0445  CHOL 145   No results found for this basename: PROTIME,  in the last 72 hours  Imaging: Imaging results have been reviewed and Dg Chest 2 View  10/29/2012   *RADIOLOGY REPORT*  Clinical Data: Chest pain.  CHEST - 2 VIEW  Comparison: 09/23/2011 and 08/15/2006  Findings: Lungs are clear.  Cardiomediastinal silhouette and remainder of the exam is unchanged.  IMPRESSION: No acute cardiopulmonary  disease.   Original Report Authenticated By: Elberta Fortis, M.D.    Cardiac Studies:  Assessment/Plan:  Acute inferoposterior wall myocardial infarction with late presentation status post PCI 100% occluded RCA with excellent results Post infarct angina  Coronary artery disease history of wall MI in the past  Hypertension  Hypercholesteremia  Tobacco abuse Plan Continue present management Check labs in a.m. Phase I cardiac rehabilitation  LOS: 1 day    [name] N 10/30/2012, 12:23 PM

## 2012-10-30 NOTE — Progress Notes (Signed)
Pt sts she feels very tired after walking to BR and doesn't feel she can walk today. Gave pt MI book and stent card and discussed events and risk factors. Will f/u in am. [id number] Ethelda Chick CES, ACSM 3:22 PM 10/30/2012

## 2012-10-30 NOTE — Care Management Note (Addendum)
    Page 1 of 1   11/02/2012     11:06:06 AM   CARE MANAGEMENT NOTE 11/02/2012  Patient:  [name], [name]   Account Number:  [id number]  Date Initiated:  10/30/2012  Documentation initiated by:  Junius Creamer  Subjective/Objective Assessment:   adm w ch pain     Action/Plan:   lives w brother, pcp was dr Norman Herrlich in [place]   Anticipated DC Date:     Anticipated DC Plan:  HOME/SELF CARE      DC Planning Services  CM consult  Medication Assistance      Choice offered to / List presented to:             Status of service:  Completed, signed off Medicare Important Message given?   (If response is "NO", the following Medicare IM given date fields will be blank) Date Medicare IM given:   Date Additional Medicare IM given:    Discharge Disposition:  HOME/SELF CARE  Per UR Regulation:  Reviewed for med. necessity/level of care/duration of stay  If discussed at Long Length of Stay Meetings, dates discussed:    Comments:   11-02-12 [address] [place] [phone] CM did have MD to sign pt assistance form for Brilinta. Pt has form and CM did call [facility] on [address] and medication is available. Pt will fax  brilinta forms from MD office because she needed tax information. No further needs form CM at this time.  8/25 1405 debbie dowell rn,bsn spoke w pt. she did have pcp in [place] but has moved to [place]. gave her pcp list for self pay in [place]. pt has no ins at present. gave pt 2 prescription discount cards. gave pt 30day free brilinta and copay assist card. pt assist form for brilinta in shadow chart for md to sign.

## 2012-10-30 NOTE — CV Procedure (Signed)
A cath/PTCA stenting report dictated on 10/29/2012 dictation number is [id number]

## 2012-10-30 NOTE — Progress Notes (Addendum)
Right femoral sheath discontinued per protocol.  Patient initially with some hypotension immediately prior to sheath pull.  NS IV bolus infused during procedure.  Manual pressure held for 20 minutes.  No evidence of bleeding or hematoma noted.  Patient instructed to keep right leg straight and to apply pressure with hand to site if she were to cough or sneeze.  Patient complied well with instructions.  Gauze dressing to site with medipore tape. Will continue to monitor closely per protocol. ACT performed prior to sheath pull was 140.

## 2012-10-31 LAB — BASIC METABOLIC PANEL
BUN: 7 mg/dL (ref 6–23)
CO2: 26 mEq/L (ref 19–32)
Calcium: 8.5 mg/dL (ref 8.4–10.5)
Creatinine, Ser: 0.59 mg/dL (ref 0.50–1.10)
Glucose, Bld: 106 mg/dL — ABNORMAL HIGH (ref 70–99)

## 2012-10-31 LAB — CBC
HCT: 32.4 % — ABNORMAL LOW (ref 36.0–46.0)
Hemoglobin: 10.9 g/dL — ABNORMAL LOW (ref 12.0–15.0)
MCH: 32.3 pg (ref 26.0–34.0)
MCV: 96.1 fL (ref 78.0–100.0)
Platelets: 106 10*3/uL — ABNORMAL LOW (ref 150–400)
RBC: 3.37 MIL/uL — ABNORMAL LOW (ref 3.87–5.11)

## 2012-10-31 NOTE — ED Provider Notes (Signed)
Medical screening examination/treatment/procedure(s) were conducted as a shared visit with non-physician practitioner(s) and myself.  I personally evaluated the patient during the encounter  Hurman Horn, MD 10/31/12 2054

## 2012-10-31 NOTE — Progress Notes (Signed)
Subjective:  Patient denies any chest pain or shortness of breath. Feels tired and fatigued. Overall feels much better. Troponin I is still remains elevated  Objective:  Vital Signs in the last 24 hours: Temp:  [98.5 F (36.9 C)-99.2 F (37.3 C)] 98.7 F (37.1 C) (08/26 0800) Pulse Rate:  [63-90] 66 (08/26 0800) Resp:  [13-31] 23 (08/26 0800) BP: (82-131)/(46-89) 87/52 mmHg (08/26 0800) SpO2:  [95 %-100 %] 97 % (08/26 0800)  Intake/Output from previous day: 08/25 0701 - 08/26 0700 In: 940 [P.O.:390; I.V.:300; IV Piggyback:250] Out: 2750 [Urine:2750] Intake/Output from this shift:    Physical Exam: Neck: no adenopathy, no carotid bruit, no JVD and supple, symmetrical, trachea midline Lungs: clear to auscultation bilaterally Heart: regular rate and rhythm, S1, S2 normal, no murmur, click, rub or gallop Abdomen: soft, non-tender; bowel sounds normal; no masses,  no organomegaly Extremities: extremities normal, atraumatic, no cyanosis or edema and Right groin stable  Lab Results:  Recent Labs  10/30/12 0500 10/31/12 0330  WBC 9.1 8.4  HGB 11.4* 10.9*  PLT 125* 106*    Recent Labs  10/30/12 0445 10/31/12 0330  NA 136 137  K 3.8 3.5  CL 103 104  CO2 27 26  GLUCOSE 128* 106*  BUN 13 7  CREATININE 0.70 0.59    Recent Labs  10/30/12 1423 10/31/12 0330  TROPONINI >20.00* >20.00*   Hepatic Function Panel  Recent Labs  10/30/12 0110  PROT 5.6*  ALBUMIN 3.2*  AST 188*  ALT 40*  ALKPHOS 59  BILITOT 0.8    Recent Labs  10/30/12 0445  CHOL 145   No results found for this basename: PROTIME,  in the last 72 hours  Imaging: Imaging results have been reviewed and Dg Chest 2 View  10/29/2012   *RADIOLOGY REPORT*  Clinical Data: Chest pain.  CHEST - 2 VIEW  Comparison: 09/23/2011 and 08/15/2006  Findings: Lungs are clear.  Cardiomediastinal silhouette and remainder of the exam is unchanged.  IMPRESSION: No acute cardiopulmonary disease.   Original Report  Authenticated By: Elberta Fortis, M.D.    Cardiac Studies:  Assessment/Plan:  Acute inferoposterior wall myocardial infarction with late presentation status post PCI 100% occluded RCA with excellent results  Post infarct angina  Coronary artery disease history of wall MI in the past  Hypertension  Hypercholesteremia  Tobacco abuse Thrombocytopenia Plan Transfer to telemetry Check labs in a.m.  LOS: 2 days    Sandrina Heaton N 10/31/2012, 8:47 AM

## 2012-10-31 NOTE — Progress Notes (Signed)
CARDIAC REHAB PHASE I   PRE:  Rate/Rhythm: 61 SR    BP: sitting 111/71    SaO2:   MODE:  Ambulation: 10, then 80 ft   POST:  Rate/Rhythm: 71 SR    BP: sitting 99/49, standing 98/47, after second walk 113/72     SaO2:   Pt continues to feel fatigued, asleep in bed on arrival. Sts bathroom trips wear her out. Pt stood and began walking but became dizzy and unsteady, only able to walk 10 ft, returned to room. BP slightly lower. Allowed to sit on EOB and rest. Then able to stand, BP stable, therefore walked 80 more feet. Still weak and tired but dizziness some better. To recliner with BP higher. Encouraged pt to sit in recliner more. Shouldn't be up by herself due to dizziness. Discussed more ed. Pt motivated to quit smoking. Interested in [facility] and will send referral to [place]. Gave financial application. Pt needs to walk more with nursing today. [phone]  Elissa Lovett McCracken CES, ACSM 10/31/2012 11:52 AM

## 2012-11-01 LAB — CBC
HCT: 30.3 % — ABNORMAL LOW (ref 36.0–46.0)
Hemoglobin: 10.4 g/dL — ABNORMAL LOW (ref 12.0–15.0)
MCH: 32.6 pg (ref 26.0–34.0)
MCHC: 34.3 g/dL (ref 30.0–36.0)
MCV: 95 fL (ref 78.0–100.0)

## 2012-11-01 LAB — BASIC METABOLIC PANEL
BUN: 7 mg/dL (ref 6–23)
CO2: 24 mEq/L (ref 19–32)
Calcium: 8.6 mg/dL (ref 8.4–10.5)
GFR calc non Af Amer: >90 mL/min (ref >90)
Glucose, Bld: 109 mg/dL — ABNORMAL HIGH (ref 70–99)

## 2012-11-01 MED ORDER — NICOTINE 14 MG/24HR TD PT24
14.0000 mg | MEDICATED_PATCH | Freq: Every day | TRANSDERMAL | Status: DC
  Administered 2012-11-01 – 2012-11-02 (×2): 14 mg via TRANSDERMAL
  Filled 2012-11-01 (×2): qty 1

## 2012-11-01 NOTE — Progress Notes (Signed)
Subjective:  Patient denies any anginal chest pain or shortness of breath overall feels better but tired and fatigued. Blood pressure is remains in low 90s. Cardiac enzymes are trending down Objective:  Vital Signs in the last 24 hours: Temp:  [97.4 F (36.3 C)-99.3 F (37.4 C)] 98.8 F (37.1 C) (08/27 0549) Pulse Rate:  [60-69] 65 (08/27 0549) Resp:  [16-22] 18 (08/27 0549) BP: (79-104)/(44-64) 96/54 mmHg (08/27 1017) SpO2:  [99 %-100 %] 99 % (08/27 0549)  Intake/Output from previous day: 08/26 0701 - 08/27 0700 In: 480 [P.O.:480] Out: 700 [Urine:700] Intake/Output from this shift:    Physical Exam: Neck: no adenopathy, no carotid bruit, no JVD and supple, symmetrical, trachea midline Lungs: clear to auscultation bilaterally Heart: regular rate and rhythm, S1, S2 normal and Soft systolic murmur noted Abdomen: soft, non-tender; bowel sounds normal; no masses,  no organomegaly Extremities: extremities normal, atraumatic, no cyanosis or edema  Lab Results:  Recent Labs  10/31/12 0330 11/01/12 0437  WBC 8.4 7.6  HGB 10.9* 10.4*  PLT 106* 108*    Recent Labs  10/31/12 0330 11/01/12 0437  NA 137 136  K 3.5 3.2*  CL 104 103  CO2 26 24  GLUCOSE 106* 109*  BUN 7 7  CREATININE 0.59 0.65    Recent Labs  10/31/12 0330 11/01/12 0430  TROPONINI >20.00* 11.95*   Hepatic Function Panel  Recent Labs  10/30/12 0110  PROT 5.6*  ALBUMIN 3.2*  AST 188*  ALT 40*  ALKPHOS 59  BILITOT 0.8    Recent Labs  10/30/12 0445  CHOL 145   No results found for this basename: PROTIME,  in the last 72 hours  Imaging: Imaging results have been reviewed and No results found.  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 possible discharge in a.m. if stable  LOS: 3 days    [name] N 11/01/2012, 11:37 AM

## 2012-11-01 NOTE — Progress Notes (Signed)
  Echocardiogram 2D Echocardiogram has been performed.  [name] FRANCES 11/01/2012, 6:48 PM

## 2012-11-01 NOTE — Care Management (Signed)
MD please fill out Brilinta Pt assistance form in [facility] Chart. Pt will need a 30 day Rx no refills to go with brilinta card. No further needs from CM at this time. Gala Lewandowsky, RN,BSN [phone]

## 2012-11-01 NOTE — Progress Notes (Signed)
CARDIAC REHAB PHASE I   PRE:  Rate/Rhythm: 80SR   bathroom  BP:  Supine:   Sitting: 96/52  Standing:    SaO2: 98%RA  MODE:  Ambulation: 200 ft   POST:  Rate/Rhythm: 74SR  BP:  Supine:   Sitting: 94/52  Standing:    SaO2: 100%RA [id number] Pt walked 200 ft with slow steady pace and asst x 1. Denied dizziness or CP. Walking to bathroom by herself and stated as long as she took it slow she was not dizzy. Reviewed NTG use and ex ed. Pt voiced understanding. To bed after walk. Son to stay with her for a while after discharge.   Luetta Nutting, RN BSN  11/01/2012 9:32 AM

## 2012-11-02 LAB — CBC
HCT: 33.5 % — ABNORMAL LOW (ref 36.0–46.0)
MCH: 33 pg (ref 26.0–34.0)
MCV: 94.4 fL (ref 78.0–100.0)
Platelets: 136 10*3/uL — ABNORMAL LOW (ref 150–400)
RBC: 3.55 MIL/uL — ABNORMAL LOW (ref 3.87–5.11)
RDW: 12.6 % (ref 11.5–15.5)

## 2012-11-02 LAB — TROPONIN I: Troponin I: 8.88 ng/mL (ref <0.30)

## 2012-11-02 LAB — BASIC METABOLIC PANEL
BUN: 6 mg/dL (ref 6–23)
CO2: 27 mEq/L (ref 19–32)
Calcium: 9.2 mg/dL (ref 8.4–10.5)
Creatinine, Ser: 0.75 mg/dL (ref 0.50–1.10)

## 2012-11-02 MED ORDER — NICOTINE 14 MG/24HR TD PT24: 1.0000 | MEDICATED_PATCH | Freq: Every day | TRANSDERMAL | Status: AC

## 2012-11-02 MED ORDER — TICAGRELOR 90 MG PO TABS: 90.0000 mg | ORAL_TABLET | Freq: Two times a day (BID) | ORAL | Status: DC

## 2012-11-02 MED ORDER — ATORVASTATIN CALCIUM 80 MG PO TABS: 80.0000 mg | ORAL_TABLET | Freq: Every day | ORAL | Status: AC

## 2012-11-02 MED ORDER — METOPROLOL TARTRATE 12.5 MG HALF TABLET: 12.5000 mg | ORAL_TABLET | Freq: Two times a day (BID) | ORAL | Status: AC

## 2012-11-02 NOTE — Discharge Summary (Signed)
[name], [name]                ACCOUNT NO.:  [id number]  MEDICAL RECORD NO.:  [id number]  LOCATION:  [id number]                        FACILITY:  [facility]  PHYSICIAN:  Eduardo Osier. Sharyn Lull, M.D. DATE OF BIRTH:  12/31/1950  DATE OF ADMISSION:  10/29/2012 DATE OF DISCHARGE:  11/02/2012                              DISCHARGE SUMMARY   ADMITTING DIAGNOSES: 1. Acute inferoposterior wall myocardial infarction with late     presentation. 2. Postinfarction angina. 3. Coronary artery disease, history of inferior wall myocardial     infarction in the past. 4. Hypertension. 5. Hypercholesteremia. 6. Tobacco abuse.  FINAL DIAGNOSES: 1. Status post acute inferoposterior wall myocardial infarction,     status post emergency PTCA stenting to 100% occluded RCA and PTCA     to PDA and PLV branch. 2. Status post postinfarct angina. 3. Coronary artery disease, history of inferior wall myocardial     infarction in the past. 4. Hypertension. 5. Hypercholesteremia. 6. Tobacco abuse.  DISCHARGE HOME MEDICATIONS: 1. Enteric-coated aspirin 81 mg 1 tablet daily. 2. Brilinta 90 mg 1 tablet twice daily. 3. Atorvastatin 80 mg 1 tablet daily. 4. Lopressor 12.5 mg twice daily. 5. Nicotine patch 14 mg per 24 hours 1 patch daily. 6. Xanax 0.5 mg at bedtime as needed as before. 7. Nitrostat 0.4 mg sublingual use as directed.  The patient has been advised to stop Plavix, Ranexa, Diovan, and Calan SR.  DIET:  Low salt, low cholesterol.  ACTIVITY:  Increase activity slowly as tolerated.  The patient will be scheduled for phase 2 cardiac rehab as outpatient.  Post-PTCA stent instructions have been given.  Follow up with me in 1 week.  CONDITION AT DISCHARGE:  Stable.  BRIEF HISTORY AND HOSPITAL COURSE:  [name] is a 62 year old female with past medical history significant for coronary artery disease, history of inferior wall myocardial infarction in the past status post PTCA stenting to RCA approximately  9 years ago at [facility], hypertension, hypercholesteremia, tobacco abuse, positive family history of coronary artery disease, she came to the ER complaining of recurrent burning retrosternal chest pain, described 10/10 since yesterday evening radiating to both shoulder, arm associated with shortness of breath and diaphoresis, did not seek any medical attention as she was in [place] visiting her friend.  The patient came home took aspirin and nitro with partial relief, but due to recurrent chest pain, decided to come to the ER.  When seen in the ER, the patient complaining of 3/6 burning chest pain.  EKG done in the ER showed normal sinus rhythm with Q-waves in inferior leads with ST elevation and minor reciprocal changes in lead 1 and aVL, also had RSR pattern with ST depression in V1-V3 suggestive of inferoposterior wall MI.  The patient was noted to have troponin of point of care 45.  The patient denies any palpitation, lightheadedness, or syncope, but states feels overall tired and weak.  The patient states she has been off most of her medication for last few weeks.  PAST MEDICAL HISTORY:  As above.  PAST SURGICAL HISTORY:  She had C-sections x3 in the past.  PHYSICAL EXAMINATION:  GENERAL:  She was alert, awake,  oriented x3. VITAL SIGNS:  Blood pressure was 134/75, pulse was 67.  She was afebrile. EYES:  Conjunctivae was pink. NECK:  Supple.  No JVD.  No bruit. LUNGS:  Clear to auscultation without rhonchi or rales. CARDIOVASCULAR:  S1, S2 was normal.  There was soft systolic murmur and S4 gallop. ABDOMEN:  Soft.  Bowel sounds were present.  Nontender. EXTREMITIES:  No clubbing, cyanosis, or edema.  LABORATORY DATA:  Sodium was 131, potassium 3.9, BUN 9, creatinine 0.50, glucose was 139, repeat fasting blood sugar has been 102.  Her point of care troponin I was 42.69.  By labs, troponin I was about 20 x4 and then it was 11.95, today it is 8.88 which is  trending down.  Her cholesterol was 145, triglycerides 109, LDL 75, HDL 48.  Hemoglobin was 14.5, hematocrit 40.1, white count of 14.5.  Repeat hemoglobin yesterday was 10.4, hematocrit 30.3, white count of 7.6.  Today, hemoglobin is 11.7, hematocrit 33.5, white count of 7.0, which has been stable for last few days.  BRIEF HOSPITAL COURSE:  The patient was directly taken to the cath lab and underwent emergency left cardiac cath with selective left and right coronary angiography and PTCA stenting to RCA as per procedure report. The patient tolerated procedure well.  The patient did not have any further episodes of chest pain during the hospital stay.  The patient did remain hypotensive requiring large boluses of fluids.  Phase 1 cardiac rehab was called.  The patient has been ambulating in hallway without any problems.  Her blood pressure today has been stable.  Her groin is stable with no evidence of hematoma or bruit.  The patient will be discharged home on above medications and will be followed up in my office in 1 week.  The patient will be scheduled for phase 2 cardiac rehab as outpatient.  The patient has been extensively counseled regarding lifestyle modification, diet, and smoking cessation to which she agrees.     Eduardo Osier. Sharyn Lull, M.D.     MNH/MEDQ  D:  11/02/2012  T:  11/02/2012  Job:  [id number]

## 2012-11-02 NOTE — Progress Notes (Signed)
CARDIAC REHAB PHASE I   PRE:  Rate/Rhythm: 76 SR    BP: sitting 110/50    SaO2:   MODE:  Ambulation: 500 ft   POST:  Rate/Rhythm: 85 SR    BP: sitting 106/50     SaO2:   Tolerated much better today. Pt has more energy, denied dizziness or SOB walking. Increased distance. Feels much better on 14 mg nicotine patch. Plans to go buy supply. [id number]   Elissa Lovett Vinton CES, ACSM 11/02/2012 9:03 AM

## 2012-11-02 NOTE — Discharge Summary (Signed)
  Discharge summary dictated on 08/02/2012 dictation number is [id number]

## 2012-12-10 ENCOUNTER — Encounter (HOSPITAL_COMMUNITY): Payer: Self-pay | Admitting: Family Medicine

## 2012-12-10 ENCOUNTER — Emergency Department (HOSPITAL_COMMUNITY): Payer: Medicaid Other

## 2012-12-10 ENCOUNTER — Observation Stay (HOSPITAL_COMMUNITY)
Admission: EM | Admit: 2012-12-10 | Discharge: 2012-12-11 | Disposition: A | Discharge status: Home / Self Care | Payer: Medicaid Other | Attending: Cardiology | Admitting: Cardiology

## 2012-12-10 DIAGNOSIS — I10 Essential (primary) hypertension: Secondary | ICD-10-CM | POA: Insufficient documentation

## 2012-12-10 DIAGNOSIS — I209 Angina pectoris, unspecified: Secondary | ICD-10-CM | POA: Insufficient documentation

## 2012-12-10 DIAGNOSIS — I2510 Atherosclerotic heart disease of native coronary artery without angina pectoris: Principal | ICD-10-CM | POA: Insufficient documentation

## 2012-12-10 DIAGNOSIS — F17200 Nicotine dependence, unspecified, uncomplicated: Secondary | ICD-10-CM | POA: Insufficient documentation

## 2012-12-10 DIAGNOSIS — R079 Chest pain, unspecified: Secondary | ICD-10-CM

## 2012-12-10 DIAGNOSIS — Z9861 Coronary angioplasty status: Secondary | ICD-10-CM | POA: Insufficient documentation

## 2012-12-10 DIAGNOSIS — I059 Rheumatic mitral valve disease, unspecified: Secondary | ICD-10-CM | POA: Insufficient documentation

## 2012-12-10 DIAGNOSIS — Z23 Encounter for immunization: Secondary | ICD-10-CM | POA: Insufficient documentation

## 2012-12-10 DIAGNOSIS — E7800 Pure hypercholesterolemia, unspecified: Secondary | ICD-10-CM | POA: Insufficient documentation

## 2012-12-10 DIAGNOSIS — Z79899 Other long term (current) drug therapy: Secondary | ICD-10-CM | POA: Insufficient documentation

## 2012-12-10 DIAGNOSIS — I252 Old myocardial infarction: Secondary | ICD-10-CM | POA: Insufficient documentation

## 2012-12-10 LAB — POCT I-STAT TROPONIN I
Troponin i, poc: 0.02 ng/mL (ref 0.00–0.08)
Troponin i, poc: 0.02 ng/mL (ref 0.00–0.08)
Troponin i, poc: 0.03 ng/mL (ref 0.00–0.08)

## 2012-12-10 LAB — CBC
MCH: 32.5 pg (ref 26.0–34.0)
MCV: 92.3 fL (ref 78.0–100.0)
Platelets: 183 10*3/uL (ref 150–400)
RBC: 4.18 MIL/uL (ref 3.87–5.11)

## 2012-12-10 LAB — BASIC METABOLIC PANEL
BUN: 10 mg/dL (ref 6–23)
CO2: 26 mEq/L (ref 19–32)
Calcium: 9.3 mg/dL (ref 8.4–10.5)
Creatinine, Ser: 0.66 mg/dL (ref 0.50–1.10)
Glucose, Bld: 106 mg/dL — ABNORMAL HIGH (ref 70–99)

## 2012-12-10 MED ORDER — ONDANSETRON HCL 4 MG/2ML IJ SOLN: 4.0000 mg | Freq: Four times a day (QID) | INTRAMUSCULAR | Status: DC | PRN

## 2012-12-10 MED ORDER — NITROGLYCERIN IN D5W 200-5 MCG/ML-% IV SOLN
5.0000 ug/min | Freq: Once | INTRAVENOUS | Status: DC
  Administered 2012-12-10: 5 ug/min via INTRAVENOUS
  Filled 2012-12-10: qty 250

## 2012-12-10 MED ORDER — HEPARIN (PORCINE) IN NACL 100-0.45 UNIT/ML-% IJ SOLN
800.0000 [IU]/h | INTRAMUSCULAR | Status: DC
  Administered 2012-12-10: 600 [IU]/h via INTRAVENOUS
  Administered 2012-12-11: 800 [IU]/h via INTRAVENOUS
  Filled 2012-12-10: qty 250

## 2012-12-10 MED ORDER — CLOPIDOGREL BISULFATE 300 MG PO TABS
300.0000 mg | ORAL_TABLET | Freq: Once | ORAL | Status: AC
  Administered 2012-12-11: 300 mg via ORAL
  Filled 2012-12-10: qty 1

## 2012-12-10 MED ORDER — NITROGLYCERIN 0.4 MG SL SUBL
0.4000 mg | SUBLINGUAL_TABLET | SUBLINGUAL | Status: DC | PRN
  Administered 2012-12-10 (×2): 0.4 mg via SUBLINGUAL
  Filled 2012-12-10: qty 25

## 2012-12-10 MED ORDER — CLOPIDOGREL BISULFATE 75 MG PO TABS
75.0000 mg | ORAL_TABLET | Freq: Every day | ORAL | Status: DC
  Administered 2012-12-11: 08:00:00 75 mg via ORAL
  Filled 2012-12-10 (×3): qty 1

## 2012-12-10 MED ORDER — NITROGLYCERIN 0.4 MG SL SUBL: 0.4000 mg | SUBLINGUAL_TABLET | SUBLINGUAL | Status: DC | PRN

## 2012-12-10 MED ORDER — NITROGLYCERIN IN D5W 200-5 MCG/ML-% IV SOLN
5.0000 ug/min | INTRAVENOUS | Status: DC
  Administered 2012-12-10: 5 ug/min via INTRAVENOUS

## 2012-12-10 MED ORDER — ASPIRIN EC 81 MG PO TBEC
81.0000 mg | DELAYED_RELEASE_TABLET | Freq: Every day | ORAL | Status: DC
  Administered 2012-12-11: 81 mg via ORAL
  Filled 2012-12-10: qty 1

## 2012-12-10 MED ORDER — ACETAMINOPHEN 325 MG PO TABS: 650.0000 mg | ORAL_TABLET | ORAL | Status: DC | PRN

## 2012-12-10 MED ORDER — NICOTINE 14 MG/24HR TD PT24
14.0000 mg | MEDICATED_PATCH | Freq: Every day | TRANSDERMAL | Status: DC
  Filled 2012-12-10: qty 1

## 2012-12-10 MED ORDER — ATORVASTATIN CALCIUM 80 MG PO TABS
80.0000 mg | ORAL_TABLET | Freq: Every day | ORAL | Status: DC
  Administered 2012-12-11: 80 mg via ORAL
  Filled 2012-12-10: qty 1

## 2012-12-10 MED ORDER — ALPRAZOLAM 0.5 MG PO TABS: 0.5000 mg | ORAL_TABLET | Freq: Every evening | ORAL | Status: DC | PRN

## 2012-12-10 MED ORDER — PANTOPRAZOLE SODIUM 40 MG PO TBEC
40.0000 mg | DELAYED_RELEASE_TABLET | Freq: Every day | ORAL | Status: DC
  Administered 2012-12-11: 01:00:00 40 mg via ORAL
  Filled 2012-12-10: qty 1

## 2012-12-10 MED ORDER — INFLUENZA VAC SPLIT QUAD 0.5 ML IM SUSP
0.5000 mL | INTRAMUSCULAR | Status: AC
  Administered 2012-12-11: 0.5 mL via INTRAMUSCULAR
  Filled 2012-12-10: qty 0.5

## 2012-12-10 MED ORDER — METOPROLOL TARTRATE 12.5 MG HALF TABLET
12.5000 mg | ORAL_TABLET | Freq: Two times a day (BID) | ORAL | Status: DC
  Administered 2012-12-11 (×2): 12.5 mg via ORAL
  Filled 2012-12-10 (×3): qty 1

## 2012-12-10 MED ORDER — HEPARIN BOLUS VIA INFUSION
3000.0000 [IU] | Freq: Once | INTRAVENOUS | Status: AC
  Administered 2012-12-10: 3000 [IU] via INTRAVENOUS
  Filled 2012-12-10: qty 3000

## 2012-12-10 NOTE — ED Notes (Addendum)
Pt from home via [facility]. Pt c/o CP x2 days, states became worse today while cleaning with mild SOB. Pt took 2SL nitro prior to EMS arrival with relief to 2/10 pain.  Pt had 2 stents placed in RCA in August 2014. 324ASA given by EMS.

## 2012-12-10 NOTE — ED Notes (Signed)
Heparin and Nitro verified with Felipa Eth, Charity fundraiser

## 2012-12-10 NOTE — H&P (Signed)
[name] is an 62 y.o. female.   Chief Complaint: Recurrent chest pain/progressive increasing shortness of breath HPI: Patient is 61 year old female with past medical history significant for coronary artery disease history of inferior wall myocardial infarction x2 one approximately 9 years ago had PTCA stenting to RCA at [facility] and then approximately 6 weeks ago requiring emergency PTCA stenting to RCA and PTCA to the PDA and PLB branch of RCA, hypertension, hypercholesteremia, tobacco abuse, came to the ER complaining of progressive increasing shortness of breath associated with feeling tired easily for last 2 weeks and since yesterday having retrosternal and left-sided chest pain described as achiness grade 7/10 took 2 sublingual nitroglycerin with partial relief to 2/10 her. Patient states chest pain occasionally goes to left shoulder. Denies any nausea vomiting diaphoresis. States this pain feels different in nature then when she had MI. EKG done in the ER showed no new acute ischemic changes 2 sets of troponin I. been negative and also was noted to have elevated BNP. Patient denies any history of PND orthopnea leg swelling denies any palpitation lightheadedness or syncope. Denies any recent immobilization.  Past Medical History  Diagnosis Date  . MI, old   . Hypertension   . Coronary artery disease     Past Surgical History  Procedure Laterality Date  . Cesarean section      X3  . Coronary angioplasty with stent placement  10/2012    No family history on file. Social History:  reports that she quit smoking about 7 weeks ago. Her smoking use included Cigarettes. She smoked 1.00 pack per day. She does not have any smokeless tobacco history on file. She reports that she drinks about 3.6 ounces of alcohol per week. She reports that she does not use illicit drugs.  Allergies: No Known Allergies   (Not in a hospital admission)  Results for orders placed during the  hospital encounter of 12/10/12 (from the past 48 hour(s))  PRO B NATRIURETIC PEPTIDE     Status: Abnormal   Collection Time    12/10/12  5:31 PM      Result Value Range   Pro B Natriuretic peptide (BNP) 2025.0 (*) 0 - 125 pg/mL  BASIC METABOLIC PANEL     Status: Abnormal   Collection Time    12/10/12  5:31 PM      Result Value Range   Sodium 139  135 - 145 mEq/L   Potassium 3.9  3.5 - 5.1 mEq/L   Chloride 101  96 - 112 mEq/L   CO2 26  19 - 32 mEq/L   Glucose, Bld 106 (*) 70 - 99 mg/dL   BUN 10  6 - 23 mg/dL   Creatinine, Ser 1.47  0.50 - 1.10 mg/dL   Calcium 9.3  8.4 - 82.9 mg/dL   GFR calc non Af Amer >90  >90 mL/min   GFR calc Af Amer >90  >90 mL/min   Comment: (NOTE)     The eGFR has been calculated using the CKD EPI equation.     This calculation has not been validated in all clinical situations.     eGFR's persistently <90 mL/min signify possible Chronic Kidney     Disease.  CBC     Status: None   Collection Time    12/10/12  5:31 PM      Result Value Range   WBC 5.1  4.0 - 10.5 K/uL   RBC 4.18  3.87 - 5.11 MIL/uL  Hemoglobin 13.6  12.0 - 15.0 g/dL   HCT 16.1  09.6 - 04.5 %   MCV 92.3  78.0 - 100.0 fL   MCH 32.5  26.0 - 34.0 pg   MCHC 35.2  30.0 - 36.0 g/dL   RDW 40.9  81.1 - 91.4 %   Platelets 183  150 - 400 K/uL  POCT I-STAT TROPONIN I     Status: None   Collection Time    12/10/12  5:41 PM      Result Value Range   Troponin i, poc 0.02  0.00 - 0.08 ng/mL   Comment 3            Comment: Due to the release kinetics of cTnI,     a negative result within the first hours     of the onset of symptoms does not rule out     myocardial infarction with certainty.     If myocardial infarction is still suspected,     repeat the test at appropriate intervals.  POCT I-STAT TROPONIN I     Status: None   Collection Time    12/10/12  7:34 PM      Result Value Range   Troponin i, poc 0.02  0.00 - 0.08 ng/mL   Comment 3            Comment: Due to the release kinetics of  cTnI,     a negative result within the first hours     of the onset of symptoms does not rule out     myocardial infarction with certainty.     If myocardial infarction is still suspected,     repeat the test at appropriate intervals.   Dg Chest 2 View  12/10/2012   CLINICAL DATA:  Chest pain and shortness of breath  EXAM: CHEST  2 VIEW  COMPARISON:  10/29/2012  FINDINGS: The heart size and mediastinal contours are within normal limits. Both lungs are clear. The visualized skeletal structures are unremarkable. Coronary stents are again noted.  IMPRESSION: No active cardiopulmonary disease.   Electronically Signed   By: Alcide Clever M.D.   On: 12/10/2012 18:41    Review of Systems  Constitutional: Negative for fever and chills.  Eyes: Negative for blurred vision and double vision.  Respiratory: Positive for cough. Negative for hemoptysis, sputum production and wheezing.   Cardiovascular: Positive for chest pain. Negative for palpitations, orthopnea, claudication, leg swelling and PND.  Gastrointestinal: Negative for nausea, vomiting and abdominal pain.  Genitourinary: Negative for dysuria and urgency.  Musculoskeletal: Negative for myalgias.  Skin: Negative for rash.  Neurological: Negative for dizziness and headaches.    Blood pressure 101/73, pulse 63, temperature 98.3 F (36.8 C), temperature source Oral, resp. rate 14, height 5' 2.99" (1.6 m), weight 51.5 kg (113 lb 8.6 oz), SpO2 100.00%. Physical Exam  Constitutional: She is oriented to person, place, and time.  HENT:  Head: Normocephalic and atraumatic.  Eyes: Conjunctivae are normal. Pupils are equal, round, and reactive to light. Left eye exhibits no discharge.  Neck: Normal range of motion. Neck supple. No tracheal deviation present. No thyromegaly present.  Cardiovascular: Normal rate and regular rhythm.   Murmur (Soft systolic murmur no S3 or S4 gallop) heard. Respiratory: Effort normal and breath sounds normal. No  respiratory distress. She has no wheezes. She has no rales.  GI: Soft. Bowel sounds are normal. She exhibits no distension. There is no tenderness. There is no rebound and no guarding.  Musculoskeletal: She exhibits no edema and no tenderness.  Lymphadenopathy:    She has no cervical adenopathy.  Neurological: She is alert and oriented to person, place, and time.     Assessment/Plan Recurrent chest pain rule out MI Dyspnea probably secondary to Brilinta questionable valvular heart disease Mild to moderate MR Coronary artery disease history of recent inferior wall MI and remote inferior wall MI status post PCI to RCA approximately 6 weeks ago Hypertension Hypercholesteremia Tobacco abuse Plan As per orders We'll schedule for nuclear stress test in a.m. Will switch Brilinta to clopidogrel and continue rest off meds  Laruth Hanger N 12/10/2012, 9:06 PM

## 2012-12-10 NOTE — ED Provider Notes (Signed)
CSN: [id number]     Arrival date & time 12/10/12  1707 History   First MD Initiated Contact with Patient 12/10/12 1711     Chief Complaint  Patient presents with  . Chest Pain   (Consider location/radiation/quality/duration/timing/severity/associated sxs/prior Treatment) HPI  [name] is a 62 y.o. female complaining of left sided non-radiating CP described as aching and dull, onset yesterday while at rest, associated SOB, fatigue and dry cough, relieved by SL NTG. Pt has not used her NTG since d/c in August. Pt has  Had full dose ASA today.  Denies fever. States that this feels different than prior MIs: not burning or severe. Worsening DOE over the last few weeks. Denies PND, orthopnea or peripheral edema.  She has been trying to quit smoking. Denies h/o DVT/PE, exogenous estrogen, recent immobilization, calf pain or swelling.    Past Medical History  Diagnosis Date  . MI, old   . Hypertension   . Coronary artery disease    Past Surgical History  Procedure Laterality Date  . Cesarean section      X3  . Coronary angioplasty with stent placement  10/2012   No family history on file. History  Substance Use Topics  . Smoking status: Former Smoker -- 1.00 packs/day    Types: Cigarettes    Quit date: 10/22/2012  . Smokeless tobacco: Not on file  . Alcohol Use: 3.6 oz/week    6 Cans of beer per week     Comment: occasionally   OB History   Grav Para Term Preterm Abortions TAB SAB Ect Mult Living                 Review of Systems 10 systems reviewed and found to be negative, except as noted in the HPI  Allergies  Review of patient's allergies indicates no known allergies.  Home Medications   Current Outpatient Rx  Name  Route  Sig  Dispense  Refill  . ALPRAZolam (XANAX) 0.5 MG tablet   Oral   Take 0.5 mg by mouth at bedtime as needed for sleep or anxiety.         Marland Kitchen atorvastatin (LIPITOR) 80 MG tablet   Oral   Take 1 tablet (80 mg total) by mouth daily at 6 PM.    30 tablet   3   . metoprolol tartrate (LOPRESSOR) 12.5 mg TABS tablet   Oral   Take 0.5 tablets (12.5 mg total) by mouth 2 (two) times daily.   30 tablet   3   . nicotine (NICODERM CQ - DOSED IN MG/24 HOURS) 14 mg/24hr patch   Transdermal   Place 1 patch onto the skin daily.   30 patch   3   . nitroGLYCERIN (NITROSTAT) 0.4 MG SL tablet   Sublingual   Place 0.4 mg under the tongue every 5 (five) minutes as needed for chest pain.         . Ticagrelor (BRILINTA) 90 MG TABS tablet   Oral   Take 1 tablet (90 mg total) by mouth 2 (two) times daily.   60 tablet   1   . aspirin EC 81 MG tablet   Oral   Take 81 mg by mouth daily.          BP 133/55  Pulse 60  Temp(Src) 98.3 F (36.8 C) (Oral)  Resp 20  SpO2 99% Physical Exam  Nursing note and vitals reviewed. Constitutional: She is oriented to person, place, and time. She appears well-developed  and well-nourished. No distress.  HENT:  Head: Normocephalic.  Mouth/Throat: Oropharynx is clear and moist.  Eyes: Conjunctivae and EOM are normal.  Neck: Normal range of motion.  Cardiovascular: Normal rate, regular rhythm and intact distal pulses.   Pulmonary/Chest: Effort normal and breath sounds normal. No stridor. No respiratory distress. She has no wheezes. She has no rales. She exhibits no tenderness.  Abdominal: Soft. Bowel sounds are normal. She exhibits no distension and no mass. There is no tenderness. There is no rebound and no guarding.  Musculoskeletal: Normal range of motion. She exhibits no edema.  No calf asymmetry, superficial collaterals, palpable cords, edema, Homans sign negative bilaterally.    Neurological: She is alert and oriented to person, place, and time.  Psychiatric: She has a normal mood and affect.    ED Course  Procedures (including critical care time) Labs Review Labs Reviewed  PRO B NATRIURETIC PEPTIDE - Abnormal; Notable for the following:    Pro B Natriuretic peptide (BNP) 2025.0 (*)     All other components within normal limits  BASIC METABOLIC PANEL - Abnormal; Notable for the following:    Glucose, Bld 106 (*)    All other components within normal limits  CBC  POCT I-STAT TROPONIN I  POCT I-STAT TROPONIN I   Imaging Review Dg Chest 2 View  12/10/2012   CLINICAL DATA:  Chest pain and shortness of breath  EXAM: CHEST  2 VIEW  COMPARISON:  10/29/2012  FINDINGS: The heart size and mediastinal contours are within normal limits. Both lungs are clear. The visualized skeletal structures are unremarkable. Coronary stents are again noted.  IMPRESSION: No active cardiopulmonary disease.   Electronically Signed   By: Alcide Clever M.D.   On: 12/10/2012 18:41    Date: 12/10/2012  Rate: 60  Rhythm: normal sinus rhythm  QRS Axis: normal  Intervals: normal  ST/T Wave abnormalities: nonspecific T wave changes  Conduction Disutrbances:none  Narrative Interpretation: Significant q waves and TWI in inferior leads unchanged from prior  Old EKG Reviewed: unchanged    MDM   1. Chest pain     MDM Number of Diagnoses or Management Options  Filed Vitals:   12/10/12 1712 12/10/12 1715  BP:  133/55  Pulse:  60  Temp:  98.3 F (36.8 C)  TempSrc:  Oral  Resp:  20  SpO2: 99% 99%     [name] is a 62 y.o. female with prior MI c/o aching left sided CP associated with SOB. Pain is different from prior MI in severity and character. EKG is unchanged and 1st troponin is negative. BNP is elevted at 2000, but lung sounds are clear and there is not peripheral edema or JVD and CXR shows no cardiomegaly or effusions. Pain is relieved with NTG and pt has had multiple doses at home and needed doses in the ED to keep pain about 2/10. I'm concerned for unstable angina and have discussed with Dr. Sharyn Lull who will admit her for Obs. Heparin and nitro started.   Medications  nitroGLYCERIN (NITROSTAT) SL tablet 0.4 mg (0.4 mg Sublingual Given 12/10/12 1900)  nitroGLYCERIN 0.2 mg/mL in  dextrose 5 % infusion (not administered)    Note: Portions of this report may have been transcribed using voice recognition software. Every effort was made to ensure accuracy; however, inadvertent computerized transcription errors may be present      Wynetta Emery, PA-C 12/10/12 2035

## 2012-12-10 NOTE — Progress Notes (Signed)
ANTICOAGULATION CONSULT NOTE - Initial Consult  Pharmacy Consult for Heparin Indication: Unstable Angina  No Known Allergies  Patient Measurements: Height: 5' 2.99" (160 cm) Weight: 113 lb 8.6 oz (51.5 kg) IBW/kg (Calculated) : 52.38 Heparin Dosing Weight: 51.5 kg  Vital Signs: Temp: 98.3 F (36.8 C) (10/05 1715) Temp src: Oral (10/05 1715) BP: 101/73 mmHg (10/05 1920) Pulse Rate: 63 (10/05 1920)  Labs:  Recent Labs  12/10/12 1731  HGB 13.6  HCT 38.6  PLT 183  CREATININE 0.66    Estimated Creatinine Clearance: 60.8 ml/min (by C-G formula based on Cr of 0.66).   Medical History: Past Medical History  Diagnosis Date  . MI, old   . Hypertension   . Coronary artery disease     Assessment: 89 YOF with hx of CAD, HTN, presented with chest pain to start IV heparin. hgb 13.6, plt 183. Not on anticoagulant PTA per [facility] Goal of Therapy:  Heparin level 0.3-0.7 units/ml Monitor platelets by anticoagulation protocol: Yes   Plan:  Give 3000 units bolus x 1  Start heparin infusion at 600 units/hr  Check anti-Xa level in 6 hours and daily while on heparin  Continue to monitor H&H and platelets   Bayard Hugger, PharmD, BCPS  Clinical Pharmacist  Pager: [phone]   12/10/2012,8:42 PM

## 2012-12-11 ENCOUNTER — Observation Stay (HOSPITAL_COMMUNITY): Payer: Medicaid Other

## 2012-12-11 ENCOUNTER — Encounter (HOSPITAL_COMMUNITY): Payer: Medicaid Other

## 2012-12-11 LAB — BASIC METABOLIC PANEL
BUN: 13 mg/dL (ref 6–23)
CO2: 28 mEq/L (ref 19–32)
Calcium: 8.8 mg/dL (ref 8.4–10.5)
GFR calc non Af Amer: >90 mL/min (ref >90)
Glucose, Bld: 111 mg/dL — ABNORMAL HIGH (ref 70–99)
Sodium: 139 mEq/L (ref 135–145)

## 2012-12-11 LAB — CBC
Hemoglobin: 12.5 g/dL (ref 12.0–15.0)
MCH: 31.9 pg (ref 26.0–34.0)
RBC: 3.92 MIL/uL (ref 3.87–5.11)

## 2012-12-11 LAB — LIPID PANEL
Cholesterol: 108 mg/dL (ref 0–200)
HDL: 40 mg/dL (ref >39)
Total CHOL/HDL Ratio: 2.7 RATIO
Triglycerides: 159 mg/dL — ABNORMAL HIGH (ref <150)
VLDL: 32 mg/dL (ref 0–40)

## 2012-12-11 MED ORDER — CLOPIDOGREL BISULFATE 75 MG PO TABS: 75.0000 mg | ORAL_TABLET | Freq: Every day | ORAL | Status: AC

## 2012-12-11 MED ORDER — HEPARIN BOLUS VIA INFUSION
2000.0000 [IU] | Freq: Once | INTRAVENOUS | Status: AC
  Administered 2012-12-11: 2000 [IU] via INTRAVENOUS
  Filled 2012-12-11: qty 2000

## 2012-12-11 MED ORDER — TECHNETIUM TC 99M SESTAMIBI GENERIC - CARDIOLITE
10.0000 | Freq: Once | INTRAVENOUS | Status: AC | PRN
  Administered 2012-12-11: 10 via INTRAVENOUS

## 2012-12-11 MED ORDER — TECHNETIUM TC 99M SESTAMIBI GENERIC - CARDIOLITE
30.0000 | Freq: Once | INTRAVENOUS | Status: AC | PRN
  Administered 2012-12-11: 10:00:00 30 via INTRAVENOUS

## 2012-12-11 MED ORDER — HEPARIN (PORCINE) IN NACL 100-0.45 UNIT/ML-% IJ SOLN
1000.0000 [IU]/h | INTRAMUSCULAR | Status: DC
  Administered 2012-12-11: 1000 [IU]/h via INTRAVENOUS
  Filled 2012-12-11: qty 250

## 2012-12-11 MED ORDER — LISINOPRIL 5 MG PO TABS: 5.0000 mg | ORAL_TABLET | Freq: Every day | ORAL | Status: AC

## 2012-12-11 MED ORDER — REGADENOSON 0.4 MG/5ML IV SOLN
INTRAVENOUS | Status: AC
  Administered 2012-12-11: 0.4 mg
  Filled 2012-12-11: qty 5

## 2012-12-11 NOTE — Progress Notes (Signed)
Patient arrived from ED via stretcher. Patient alert, oriented and ambulatory with stand-by assist. Admission weight, vitals and assessment completed.  Fall and safety plan reviewed with patient. Call light within reach, patient currently resting comfortably. Will continue to monitor. Blood pressure 118/63, pulse 54, temperature 98.2 F (36.8 C), temperature source Oral, resp. rate 18, height 5\' 3"  (1.6 m), weight 50.9 kg (112 lb 3.4 oz), SpO2 99.00%. [name]

## 2012-12-11 NOTE — Progress Notes (Signed)
ANTICOAGULATION CONSULT NOTE Pharmacy Consult for Heparin Indication: Unstable Angina  No Known Allergies  Patient Measurements: Height: 5\' 3"  (160 cm) Weight: 112 lb 3.4 oz (50.9 kg) IBW/kg (Calculated) : 52.4 Heparin Dosing Weight: 51.5 kg  Vital Signs: Temp: 98.2 F (36.8 C) (10/06 0300) Temp src: Oral (10/06 0300) BP: 118/63 mmHg (10/06 0300) Pulse Rate: 54 (10/06 0300)  Labs:  Recent Labs  12/10/12 1731 12/11/12 0435  HGB 13.6 12.5  HCT 38.6 36.3  PLT 183 167  HEPARINUNFRC  --  0.10*  CREATININE 0.66  --     Estimated Creatinine Clearance: 60.1 ml/min (by C-G formula based on Cr of 0.66).  Assessment: 62 y.o. female with chest pain for heparin  Goal of Therapy:  Heparin level 0.3-0.7 units/ml Monitor platelets by anticoagulation protocol: Yes   Plan:  Heparin 2000 units IV bolus, then increase heparin 800 units/hr Check heparin level in 6 hours.  Geannie Risen, PharmD, BCPS   12/11/2012,5:22 AM

## 2012-12-11 NOTE — Progress Notes (Signed)
Discharge instructions given to pt. Patient instructed to call Dr. Annitta Jersey office in 1 week for f/u appt. Prescription for Lisinopril given to patient. Prescription for Plavix sent to [facility]. [facility]. Patient understands instructions. Monitor d/c'd and cleaned, CCMD notified.

## 2012-12-11 NOTE — Progress Notes (Signed)
Flu vaccine information given to patient. Patients has no concerns regarding vaccine and no questions at this time.

## 2012-12-11 NOTE — Progress Notes (Signed)
ANTICOAGULATION CONSULT NOTE - Follow Up Consult  Pharmacy Consult for heparin Indication: unstable angina  No Known Allergies  Patient Measurements: Height: 5\' 3"  (160 cm) Weight: 112 lb 3.4 oz (50.9 kg) IBW/kg (Calculated) : 52.4 Heparin Dosing Weight: 50kg  Vital Signs: Temp: 97.9 F (36.6 C) (10/06 0736) Temp src: Oral (10/06 0736) BP: 127/63 mmHg (10/06 1114) Pulse Rate: 62 (10/06 1114)  Labs:  Recent Labs  12/10/12 1731 12/11/12 0435 12/11/12 1220  HGB 13.6 12.5  --   HCT 38.6 36.3  --   PLT 183 167  --   HEPARINUNFRC  --  0.10* 0.17*  CREATININE 0.66 0.75  --     Estimated Creatinine Clearance: 60.1 ml/min (by C-G formula based on Cr of 0.75).   Medications:  Heparin at 800units/hr  Assessment: 62 YOF with significant cardiac history, most recently MI about 6 weeks ago who is admitting with SOB and CP. Started on IV heparin. First HL was subtherapeutic and rate was increased to 800 units/hr. Check on that rate is still low at 0.17. No issues with line and not held for any reason per RN. CBC all WNL. No bleeding noted.  Goal of Therapy:  Heparin level 0.3-0.7 units/ml Monitor platelets by anticoagulation protocol: Yes   Plan:  1. Heparin bolus with 2000 units IV x1 2. Increase heparin drip to 1000 units/hr 3. HL in 6 hours 4. Daily HL and CBC 5. Follow up results of stress test and long term plans  Miski Feldpausch D. Sabrine Patchen, PharmD Clinical Pharmacist Pager: [phone] 12/11/2012 1:25 PM

## 2012-12-11 NOTE — Discharge Summary (Signed)
  Discharge summary dictated on 12/11/2012 dictation number is [phone]

## 2012-12-11 NOTE — Progress Notes (Signed)
Subjective:  Patient denies any further chest pain or shortness of breath.  Schedule for nuclear stress test today  Objective:  Vital Signs in the last 24 hours: Temp:  [97.5 F (36.4 C)-98.3 F (36.8 C)] 97.9 F (36.6 C) (10/06 0736) Pulse Rate:  [54-77] 62 (10/06 1323) Resp:  [14-38] 18 (10/06 1323) BP: (101-155)/(42-97) 112/60 mmHg (10/06 1323) SpO2:  [95 %-100 %] 100 % (10/06 1323) Weight:  [50.9 kg (112 lb 3.4 oz)-51.5 kg (113 lb 8.6 oz)] 50.9 kg (112 lb 3.4 oz) (10/06 0300)  Intake/Output from previous day: 10/05 0701 - 10/06 0700 In: -  Out: 50 [Urine:50] Intake/Output from this shift: Total I/O In: 480 [P.O.:480] Out: -   Physical Exam: Neck: no adenopathy, no carotid bruit, no JVD and supple, symmetrical, trachea midline Lungs: clear to auscultation bilaterally Heart: regular rate and rhythm, S1, S2 normal and soft systolic murmur noted no S3 gallop Abdomen: soft, non-tender; bowel sounds normal; no masses,  no organomegaly Extremities: extremities normal, atraumatic, no cyanosis or edema  Lab Results:  Recent Labs  12/10/12 1731 12/11/12 0435  WBC 5.1 5.4  HGB 13.6 12.5  PLT 183 167    Recent Labs  12/10/12 1731 12/11/12 0435  NA 139 139  K 3.9 4.1  CL 101 104  CO2 26 28  GLUCOSE 106* 111*  BUN 10 13  CREATININE 0.66 0.75    Recent Labs  12/11/12 1220  TROPONINI <0.30   Hepatic Function Panel No results found for this basename: PROT, ALBUMIN, AST, ALT, ALKPHOS, BILITOT, BILIDIR, IBILI,  in the last 72 hours  Recent Labs  12/11/12 0435  CHOL 108   No results found for this basename: PROTIME,  in the last 72 hours  Imaging: Imaging results have been reviewed and Dg Chest 2 View  12/10/2012   CLINICAL DATA:  Chest pain and shortness of breath  EXAM: CHEST  2 VIEW  COMPARISON:  10/29/2012  FINDINGS: The heart size and mediastinal contours are within normal limits. Both lungs are clear. The visualized skeletal structures are unremarkable.  Coronary stents are again noted.  IMPRESSION: No active cardiopulmonary disease.   Electronically Signed   By: Alcide Clever M.D.   On: 12/10/2012 18:41    Cardiac Studies:  Assessment/Plan:  Status postRecurrent chest pain MI ruled out Dyspnea probably secondary to Brilinta questionable valvular heart disease  Mild to moderate MR  Coronary artery disease history of recent inferior wall MI and remote inferior wall MI status post PCI to RCA approximately 6 weeks ago  Hypertension  Hypercholesteremia  Tobacco abuse Plan Continue present management Check nuclear stress test results  LOS: 1 day    [name] N 12/11/2012, 3:52 PM

## 2012-12-11 NOTE — Progress Notes (Signed)
Patient d/c'd to home and taken out via wheelchair.

## 2012-12-11 NOTE — Progress Notes (Signed)
Heparin IV Bolus of 2000units/hr given. Rate increased to 20ml/hr. Cosigned by Glenford Peers.

## 2012-12-11 NOTE — Progress Notes (Signed)
Utilization Review Completed.   Dequavion Follette, RN, BSN Nurse Case Manager  [phone]  

## 2012-12-12 NOTE — Discharge Summary (Signed)
[name], [name]                ACCOUNT NO.:  [id number]  MEDICAL RECORD NO.:  [id number]  LOCATION:  4E19C                        FACILITY:  [facility]  PHYSICIAN:  Eduardo Osier. Sharyn Lull, M.D. DATE OF BIRTH:  [birth year]  DATE OF ADMISSION:  12/10/2012 DATE OF DISCHARGE:  12/11/2012                              DISCHARGE SUMMARY   ADMITTING DIAGNOSES: 1. Recurrent chest pain, rule out myocardial infarction, dyspnea     probably secondary to Brilinta, questionable due to valvular heart     disease, mild-to-moderate mitral regurgitation. 2. Coronary artery disease, history of recent inferior wall myocardial     infarction and remote inferior wall myocardial infarction, status     post percutaneous transluminal coronary angioplasty stenting to     right coronary artery approximately 6 weeks ago. 3. Hypertension. 4. Hypercholesteremia. 5. Tobacco abuse.  DISCHARGE DIAGNOSES: 1. Stable angina.  Negative Lexiscan Myoview. 2. Status post dyspnea. 3. Mild-to-moderate mitral regurgitation. 4. Coronary artery disease. 5. History of recent inferior wall myocardial infarction and remote     inferior wall myocardial infarction, status post percutaneous     coronary intervention to right coronary artery approximately 6     weeks ago. 6. Hypertension. 7. Hypercholesteremia. 8. tobacco abuse.  DISCHARGE HOME MEDICATIONS: 1. Clopidogrel 75 mg 1 tablet daily. 2. Lisinopril 5 mg 1 tablet daily. 3. Xanax 0.5 mg at bedtime as needed. 4. Aspirin 81 mg 1 tablet daily. 5. Atorvastatin 80 mg 1 tablet daily. 6. Metoprolol tartrate 12.5 mg twice daily. 7. Nicotine patch 14 mg per 24 hours daily. 8. Nitrostat sublingual use as directed.  The patient has been advised     to stop Brilinta.  DIET:  Low salt, low cholesterol.  ACTIVITY:  Increase activity as tolerated.  FOLLOWUP:  Follow up with me in 1 week.  CONDITION AT DISCHARGE:  Stable.  BRIEF HISTORY AND HOSPITAL COURSE:  [name] is a  62 year old female with past medical history significant for coronary artery disease, history of inferior wall myocardial infarction x2, 1 approximately 9 years ago had PTCA stenting to RCA at [facility] and then subsequently approximately 6 weeks ago requiring PTCA stenting to RCA and PTCA to PDA and PLV branch of RCA, hypertension, hypercholesteremia, tobacco abuse.  She came to the ER complaining of progressive increasing shortness of breath associated with feeling tired easily for the last 2 weeks and since yesterday having retrosternal and left-sided chest pain described as achiness grade 7/10, took 2 sublingual nitro with partial relief 2/10.  The patient states the chest pain occasionally goes to left shoulder.  Denies any nausea, vomiting, diaphoresis.  She states feels chest pain, feels different in nature when she had MI.  EKG done in the ER showed no acute ischemic new changes.  Two sets of troponin-I were negative.  The patient was also noted to have mildly elevated BNP.  The patient denies any history of PND, orthopnea, or leg swelling.  Denies palpitation, lightheadedness, or syncope.  Denies any history of recent immobilization or long travel.  PAST MEDICAL HISTORY:  As above.  PAST SURGICAL HISTORY:  Had C-section x3 and coronary angiography with stent placement recently as above.  PHYSICAL EXAMINATION:  GENERAL:  She was alert, awake, oriented x3. VITAL SIGNS:  Blood pressure was 101/73, pulse was 53.  She was afebrile.  Conjunctivae was pink. NECK:  Supple.  No JVD.  No bruit. LUNGS:  Clear to auscultation without rhonchi or rales. CARDIOVASCULAR:  S1, S2 was normal.  There was soft systolic murmur.  No S3 or S4 gallop. ABDOMEN:  Soft.  Bowel sounds were present.  Nontender. EXTREMITIES:  There is no clubbing, cyanosis, or edema.  LABORATORY DATA:  Sodium was 139, potassium 3.9, BUN 10, creatinine 0.66.  Glucose was slightly elevated 106.  Three  sets of cardiac enzymes were negative.  Cholesterol was 108, triglycerides 159, HDL 40, LDL 36. Hemoglobin 13.6, hematocrit 38.6, white count of 5.1.  Chest x-ray showed no active cardiopulmonary disease.  EKG showed normal sinus rhythm, old inferior wall myocardial infarction.  RSR pattern in V1, Lexiscan Myoview scan showed negative for pharmacologic stress-induced ischemia.  Six mild perfusion defect in the anterior wall and inferoseptal region of the left ventricle with EF of 45%.  BRIEF HOSPITAL COURSE:  The patient was admitted to telemetry unit.  MI was ruled out by a serial enzymes and EKG.  The patient subsequently underwent Lexiscan Myoview which showed no evidence of reversible ischemia as above with EF of 45%.  The patient did not have any further episodes of chest pain.  Her Brilinta was switched to Plavix with improvement in her dyspnea.  The patient will be discharged home on above medications.  Low-dose ACE inhibitors were also added in view of asymptomatic LV dysfunction.  The patient will be discharged home on above medications, and will be followed up in my office in 1 week.  The patient has been advised to refrain from smoking to which she agrees.    Eduardo Osier. Sharyn Lull, M.D.    [facility]/MED[facility]  D:  12/11/2012  T:  12/12/2012  Job:  [id number]

## 2012-12-18 NOTE — ED Provider Notes (Signed)
Medical screening examination/treatment/procedure(s) were conducted as a shared visit with non-physician practitioner(s) and myself.  I personally evaluated the patient during the encounter.  62yF with CP. Some typical features. Discussed with cardiologist by Joni Reining. Admission.   Raeford Razor, MD 12/18/12 [phone]

## 2013-11-23 ENCOUNTER — Other Ambulatory Visit (HOSPITAL_COMMUNITY): Payer: Self-pay | Admitting: Cardiology

## 2013-11-23 DIAGNOSIS — R071 Chest pain on breathing: Secondary | ICD-10-CM

## 2013-11-28 ENCOUNTER — Encounter (HOSPITAL_COMMUNITY)
Admission: RE | Admit: 2013-11-28 | Discharge: 2013-11-28 | Disposition: A | Discharge status: Home / Self Care | Payer: Medicaid Other | Source: Ambulatory Visit | Attending: Cardiology | Admitting: Cardiology

## 2013-11-28 DIAGNOSIS — R071 Chest pain on breathing: Secondary | ICD-10-CM

## 2013-11-28 MED ORDER — TECHNETIUM TC 99M SESTAMIBI GENERIC - CARDIOLITE
30.0000 | Freq: Once | INTRAVENOUS | Status: AC | PRN
  Administered 2013-11-28: 30 via INTRAVENOUS

## 2013-11-28 MED ORDER — REGADENOSON 0.4 MG/5ML IV SOLN: 0.4000 mg | Freq: Once | INTRAVENOUS | Status: AC

## 2013-11-28 MED ORDER — TECHNETIUM TC 99M SESTAMIBI GENERIC - CARDIOLITE
10.0000 | Freq: Once | INTRAVENOUS | Status: AC | PRN
  Administered 2013-11-28: 10 via INTRAVENOUS

## 2013-11-28 MED ORDER — REGADENOSON 0.4 MG/5ML IV SOLN
INTRAVENOUS | Status: AC
  Administered 2013-11-28: 0.4 mg
  Filled 2013-11-28: qty 5

## 2014-02-14 ENCOUNTER — Encounter (HOSPITAL_COMMUNITY): Payer: Self-pay | Admitting: Cardiology

## 2018-08-30 ENCOUNTER — Telehealth: Payer: Self-pay

## 2018-08-30 NOTE — Telephone Encounter (Signed)
NOTES FROM [facility] [phone], REFERRAL SENT TO SCHEDULING AND NOTES FILE

## 2018-08-30 NOTE — Telephone Encounter (Signed)
NOTES ON FILE FROM [facility] [phone], SENT REFERRAL TO SCHEDULING 

## 2018-10-04 ENCOUNTER — Other Ambulatory Visit (HOSPITAL_COMMUNITY): Payer: Self-pay | Admitting: Cardiology

## 2018-10-04 ENCOUNTER — Other Ambulatory Visit: Payer: Self-pay | Admitting: Cardiology

## 2018-10-04 DIAGNOSIS — R079 Chest pain, unspecified: Secondary | ICD-10-CM

## 2018-10-09 ENCOUNTER — Ambulatory Visit (HOSPITAL_COMMUNITY)
Admission: RE | Admit: 2018-10-09 | Discharge: 2018-10-09 | Disposition: A | Discharge status: Home / Self Care | Payer: Medicare PPO | Source: Ambulatory Visit | Attending: Cardiology | Admitting: Cardiology

## 2018-10-09 ENCOUNTER — Other Ambulatory Visit: Payer: Self-pay

## 2018-10-09 DIAGNOSIS — R079 Chest pain, unspecified: Secondary | ICD-10-CM | POA: Diagnosis present

## 2018-10-09 MED ORDER — TECHNETIUM TC 99M TETROFOSMIN IV KIT
10.0000 | PACK | Freq: Once | INTRAVENOUS | Status: AC | PRN
  Administered 2018-10-09: 11:00:00 10 via INTRAVENOUS

## 2018-10-09 MED ORDER — TECHNETIUM TC 99M TETROFOSMIN IV KIT
30.0000 | PACK | Freq: Once | INTRAVENOUS | Status: AC | PRN
  Administered 2018-10-09: 12:00:00 30 via INTRAVENOUS

## 2018-10-09 MED ORDER — REGADENOSON 0.4 MG/5ML IV SOLN
0.4000 mg | Freq: Once | INTRAVENOUS | Status: AC
Start: 2018-10-09 — End: 2018-10-09
  Administered 2018-10-09: 0.4 mg via INTRAVENOUS

## 2018-10-09 MED ORDER — REGADENOSON 0.4 MG/5ML IV SOLN
INTRAVENOUS | Status: AC
  Filled 2018-10-09: qty 5

## 2019-01-09 ENCOUNTER — Encounter: Payer: Self-pay | Admitting: General Practice
